# Patient Record
Sex: Male | Born: 1935
Health system: Southern US, Community
[De-identification: ages and names within clinical notes are randomized; demographics above are authoritative.]

## PROBLEM LIST (undated history)

## (undated) DIAGNOSIS — R001 Bradycardia, unspecified: Secondary | ICD-10-CM

## (undated) HISTORY — PX: TONSILLECTOMY: SUR1361

---

## 2018-06-21 DIAGNOSIS — R001 Bradycardia, unspecified: Secondary | ICD-10-CM

## 2018-06-21 DIAGNOSIS — N179 Acute kidney failure, unspecified: Secondary | ICD-10-CM

## 2018-06-21 DIAGNOSIS — I471 Supraventricular tachycardia: Secondary | ICD-10-CM

## 2018-06-21 DIAGNOSIS — R55 Syncope and collapse: Secondary | ICD-10-CM

## 2018-06-24 DIAGNOSIS — I471 Supraventricular tachycardia, unspecified: Secondary | ICD-10-CM

## 2018-06-24 DIAGNOSIS — R55 Syncope and collapse: Secondary | ICD-10-CM

## 2018-06-24 HISTORY — DX: Supraventricular tachycardia, unspecified: I47.10

## 2018-06-24 HISTORY — DX: Syncope and collapse: R55

## 2018-06-30 ENCOUNTER — Encounter: Payer: Self-pay | Admitting: Cardiology

## 2018-06-30 ENCOUNTER — Ambulatory Visit: Payer: Medicare PPO | Admitting: Cardiology

## 2018-06-30 DIAGNOSIS — R55 Syncope and collapse: Secondary | ICD-10-CM | POA: Diagnosis not present

## 2018-06-30 DIAGNOSIS — R001 Bradycardia, unspecified: Secondary | ICD-10-CM | POA: Diagnosis not present

## 2018-06-30 DIAGNOSIS — I471 Supraventricular tachycardia: Secondary | ICD-10-CM

## 2018-06-30 HISTORY — DX: Bradycardia, unspecified: R00.1

## 2018-06-30 NOTE — Progress Notes (Signed)
Cardiology Office Note:    Date:  06/30/2018   ID:  Dillon Wallace, DOB 08-16-1933, MRN [id number]  PCP:  Lowella Dandy, NP  Cardiologist:  Shirlee More, MD   Referring MD: Lowella Dandy, NP  ASSESSMENT:    1. PSVT (paroxysmal supraventricular tachycardia) ([place])   2. Near syncope   3. Bradycardia with 41-50 beats per minute    PLAN:    In order of problems listed above:  1. I suspect this episode of brief SVT is unrelated to his clinical syndrome of bradycardia and syncope for further evaluation I asked him use a 14-day ZIO monitor unfortunately he does not have a smart phone and cannot use the iPhone adapter and if he had recurrent episodes I would favor an implanted loop recorder.  I suspect bradycardia is the mechanism. 2. See above I doubt if SVT is the etiology 3. Further evaluation 14-day ambulatory monitor and if recurrent implanted loop recorder.  He had normal echo in the hospital and I do not think he requires an ischemia evaluation  Next appointment   Medication Adjustments/Labs and Tests Ordered: Current medicines are reviewed at length with the patient today.  Concerns regarding medicines are outlined above.  No orders of the defined types were placed in this encounter.  No orders of the defined types were placed in this encounter.    Chief Complaint  Patient presents with  . Loss of Consciousness    History of Present Illness:    Dillon Wallace is a 85 y.o. male who is being seen today for the evaluation of near syncope,  undocumented bradycardia and SVT  at the request of Moon, Amy A, NP. He recently presented to [facility] with repeated episodes of near loss of consciousness.  There is a notation that he had heart rates in the 40s but unfortunately is no documented arrhythmia during EMS transport.  In the hospitalist, note that he had brief SVT no other arrhythmia was seen during his hospitalization he has advised to see cardiology following discharge.  An  echocardiogram showed normal left ventricular function otherwise normal CT of the head was unremarkable laboratory studies showed a normal CBC renal function was normal troponin and d-dimer were normal BNP level low 176.  EKG performed at the hospital independently reviewed shows sinus rhythm single PVCs right bundle branch block.  Interestingly with the issue of bradycardia he was put on a beta-blocker at discharge metoprolol unspecified dose unspecified frequency.  Prior to his office visit I reviewed the hospital records including emergency room notes as well as discharge summary and independently reviewed the EKG  Approximately 1 year ago he had an episode of vertigo dissimilar from this.  This episode was characterized by feeling he thinks consciousness his vision great out he had some headache with it and every time he would stand up the symptoms recur 1 of his neighbors and nurse came over and said his pulse was in the low 40s at the time.  He has no known history of heart disease no exercise intolerance chest pain shortness of breath no palpitation or syncope.  Been under intense stress with his wife's prolonged hospitalization with diverticulitis surgery and colostomy.  He takes no over-the-counter proarrhythmic drugs he has had no recurrent episodes and he has not taken the dose of beta-blocker prescribed at the hospital.  History reviewed. No pertinent past medical history.  Past Surgical History:  Procedure Laterality Date  . TONSILLECTOMY      Current  Medications: Current Meds  Medication Sig  . metoprolol tartrate (LOPRESSOR) 25 MG tablet Take 25 mg by mouth 2 (two) times daily as needed.   . montelukast (SINGULAIR) 10 MG tablet Take 10 mg by mouth at bedtime.     Allergies:   Patient has no allergy information on record.   Social History   Socioeconomic History  . Marital status: Married    Spouse name: Not on file  . Number of children: Not on file  . Years of education: Not  on file  . Highest education level: Not on file  Occupational History  . Not on file  Social Needs  . Financial resource strain: Not on file  . Food insecurity:    Worry: Not on file    Inability: Not on file  . Transportation needs:    Medical: Not on file    Non-medical: Not on file  Tobacco Use  . Smoking status: Former Smoker    Packs/day: 1.00    Years: 5.00    Pack years: 5.00    Last attempt to quit: 06/23/1962    Years since quitting: 56.0  . Smokeless tobacco: Never Used  Substance and Sexual Activity  . Alcohol use: Yes    Comment: occassionally  . Drug use: Never  . Sexual activity: Not on file  Lifestyle  . Physical activity:    Days per week: Not on file    Minutes per session: Not on file  . Stress: Not on file  Relationships  . Social connections:    Talks on phone: Not on file    Gets together: Not on file    Attends religious service: Not on file    Active member of club or organization: Not on file    Attends meetings of clubs or organizations: Not on file    Relationship status: Not on file  Other Topics Concern  . Not on file  Social History Narrative  . Not on file     Family History: The patient's family history includes COPD in his brother; Heart attack in his father.  ROS:   Review of Systems  Constitution: Negative.  HENT: Negative.   Eyes: Positive for blurred vision (with near syncope).  Cardiovascular: Positive for near-syncope. Negative for leg swelling.  Respiratory: Negative.   Endocrine: Negative.   Hematologic/Lymphatic: Negative.   Skin: Negative.   Musculoskeletal: Negative.   Gastrointestinal: Negative.   Genitourinary: Negative.   Neurological: Positive for headaches and vertigo.  Psychiatric/Behavioral: Negative.   Allergic/Immunologic: Negative.    Please see the history of present illness.     All other systems reviewed and are negative.  EKGs/Labs/Other Studies Reviewed:    The following studies were reviewed  today:   EKG:  EKG is  ordered today.  The ekg ordered today demonstrates [facility] and normal  Recent Labs: No results found for requested labs within last 8760 hours.  Recent Lipid Panel No results found for: CHOL, TRIG, HDL, CHOLHDL, VLDL, LDLCALC, LDLDIRECT  Physical Exam:    VS:  BP (!) 138/54 (BP Location: Left Arm, Patient Position: Sitting, Cuff Size: Normal)   Pulse 73   Ht 5\' 10"  (1.778 m)   Wt 185 lb 2 oz (84 kg)   SpO2 98%   BMI 26.56 kg/m     Wt Readings from Last 3 Encounters:  06/30/18 185 lb 2 oz (84 kg)     GEN:  Well nourished, well developed in no acute distress HEENT: Normal NECK:  No JVD; No carotid bruits LYMPHATICS: No lymphadenopathy CARDIAC: RRR, no murmurs, rubs, gallops RESPIRATORY:  Clear to auscultation without rales, wheezing or rhonchi  ABDOMEN: Soft, non-tender, non-distended MUSCULOSKELETAL:  No edema; No deformity  SKIN: Warm and dry NEUROLOGIC:  Alert and oriented x 3 PSYCHIATRIC:  Normal affect     Signed, Shirlee More, MD  06/30/2018 2:21 PM    [facility]

## 2018-06-30 NOTE — Patient Instructions (Signed)
Medication Instructions:  Your physician has recommended you make the following change in your medication:   STOP metoprolol tartrate (lopressor)  If you need a refill on your cardiac medications before your next appointment, please call your pharmacy.   Lab work: None  If you have labs (blood work) drawn today and your tests are completely normal, you will receive your results only by: Marland Kitchen MyChart Message (if you have MyChart) OR . A paper copy in the mail If you have any lab test that is abnormal or we need to change your treatment, we will call you to review the results.  Testing/Procedures: You had an EKG today.  Your physician has recommended that you wear a ZIO monitor. ZIO monitors are medical devices that record the heart's electrical activity. Doctors most often use these monitors to diagnose arrhythmias. Arrhythmias are problems with the speed or rhythm of the heartbeat. The monitor is a small, portable device. You can wear one while you do your normal daily activities. This is usually used to diagnose what is causing palpitations/syncope (passing out). Wear for 14 days.   Follow-Up: At [facility], you and your health needs are our priority.  As part of our continuing mission to provide you with exceptional heart care, we have created designated Provider Care Teams.  These Care Teams include your primary Cardiologist (physician) and Advanced Practice Providers (APPs -  Physician Assistants and Nurse Practitioners) who all work together to provide you with the care you need, when you need it. You will need a follow up appointment in 6 weeks.

## 2018-07-08 ENCOUNTER — Inpatient Hospital Stay (HOSPITAL_COMMUNITY)
Admission: AD | Admit: 2018-07-08 | Discharge: 2018-07-10 | DRG: 244 | Disposition: A | Discharge status: Home / Self Care | Payer: Medicare PPO | Source: Other Acute Inpatient Hospital | Attending: Cardiovascular Disease | Admitting: Cardiovascular Disease

## 2018-07-08 ENCOUNTER — Other Ambulatory Visit: Payer: Self-pay

## 2018-07-08 ENCOUNTER — Encounter (HOSPITAL_COMMUNITY): Payer: Self-pay | Admitting: General Practice

## 2018-07-08 DIAGNOSIS — R55 Syncope and collapse: Secondary | ICD-10-CM | POA: Diagnosis present

## 2018-07-08 DIAGNOSIS — Z87891 Personal history of nicotine dependence: Secondary | ICD-10-CM

## 2018-07-08 DIAGNOSIS — Z8249 Family history of ischemic heart disease and other diseases of the circulatory system: Secondary | ICD-10-CM

## 2018-07-08 DIAGNOSIS — Z880 Allergy status to penicillin: Secondary | ICD-10-CM

## 2018-07-08 DIAGNOSIS — I4510 Unspecified right bundle-branch block: Secondary | ICD-10-CM | POA: Diagnosis present

## 2018-07-08 DIAGNOSIS — I441 Atrioventricular block, second degree: Principal | ICD-10-CM | POA: Diagnosis present

## 2018-07-08 DIAGNOSIS — Z825 Family history of asthma and other chronic lower respiratory diseases: Secondary | ICD-10-CM | POA: Diagnosis not present

## 2018-07-08 DIAGNOSIS — I10 Essential (primary) hypertension: Secondary | ICD-10-CM | POA: Diagnosis present

## 2018-07-08 DIAGNOSIS — Z950 Presence of cardiac pacemaker: Secondary | ICD-10-CM

## 2018-07-08 HISTORY — DX: Bradycardia, unspecified: R00.1

## 2018-07-08 HISTORY — DX: Syncope and collapse: R55

## 2018-07-08 LAB — CBC
HCT: 40.1 % (ref 39.0–52.0)
Hemoglobin: 13.7 g/dL (ref 13.0–17.0)
MCH: 34 pg (ref 26.0–34.0)
MCHC: 34.2 g/dL (ref 30.0–36.0)
MCV: 99.5 fL (ref 80.0–100.0)
PLATELETS: 128 10*3/uL — AB (ref 150–400)
RBC: 4.03 MIL/uL — ABNORMAL LOW (ref 4.22–5.81)
RDW: 12.8 % (ref 11.5–15.5)
WBC: 5.5 10*3/uL (ref 4.0–10.5)
nRBC: 0 % (ref 0.0–0.2)

## 2018-07-08 LAB — CREATININE, SERUM
Creatinine, Ser: 1.21 mg/dL (ref 0.61–1.24)
GFR calc Af Amer: >60 mL/min (ref >60)
GFR calc non Af Amer: 55 mL/min — ABNORMAL LOW (ref >60)

## 2018-07-08 LAB — TROPONIN I: Troponin I: <0.03 ng/mL (ref <0.03)

## 2018-07-08 LAB — PROTIME-INR
INR: 1.03
Prothrombin Time: 13.4 seconds (ref 11.4–15.2)

## 2018-07-08 MED ORDER — HYDRALAZINE HCL 25 MG PO TABS
25.0000 mg | ORAL_TABLET | Freq: Three times a day (TID) | ORAL | Status: DC
  Administered 2018-07-08 – 2018-07-10 (×6): 25 mg via ORAL
  Filled 2018-07-08 (×6): qty 1

## 2018-07-08 MED ORDER — SODIUM CHLORIDE 0.9 % IV SOLN
INTRAVENOUS | Status: DC
  Administered 2018-07-08 – 2018-07-10 (×3): via INTRAVENOUS

## 2018-07-08 MED ORDER — VITAMIN C 500 MG PO TABS
250.0000 mg | ORAL_TABLET | Freq: Every day | ORAL | Status: DC
  Administered 2018-07-08 – 2018-07-10 (×3): 250 mg via ORAL
  Filled 2018-07-08 (×3): qty 1

## 2018-07-08 MED ORDER — MONTELUKAST SODIUM 10 MG PO TABS
10.0000 mg | ORAL_TABLET | Freq: Every day | ORAL | Status: DC
  Administered 2018-07-08 – 2018-07-09 (×2): 10 mg via ORAL
  Filled 2018-07-08 (×2): qty 1

## 2018-07-08 MED ORDER — ADULT MULTIVITAMIN W/MINERALS CH
1.0000 | ORAL_TABLET | Freq: Every day | ORAL | Status: DC
  Administered 2018-07-08 – 2018-07-10 (×3): 1 via ORAL
  Filled 2018-07-08 (×3): qty 1

## 2018-07-08 MED ORDER — HEPARIN SODIUM (PORCINE) 5000 UNIT/ML IJ SOLN
5000.0000 [IU] | Freq: Three times a day (TID) | INTRAMUSCULAR | Status: DC
  Administered 2018-07-08 – 2018-07-09 (×2): 5000 [IU] via SUBCUTANEOUS
  Filled 2018-07-08 (×2): qty 1

## 2018-07-08 NOTE — Progress Notes (Signed)
Pt arrived to 4E-18 via stretcher by care line from [facility]. Pt sat in chair. CHG bath given. Tele applied, CCMD notified. Dr. Doylene Canard notified pt arrived to floor. Awaiting orders, will continue to monitor.  Amanda Cockayne, RN

## 2018-07-08 NOTE — H&P (Signed)
Referring Physician: Laverna Peace, NP/[place] [facility] ED  Dillon Wallace is an 85 y.o. male.                       Chief Complaint: Near syncope  HPI: 85 year old male has 3 weeks history of near syncope episodes. He had documented bradycardia and in [place] ED he had 2nd degree AV block type 2 with RBBB. He is not taking any medications other than multivitamin, vitamin C and Montelukast 10 mg. He denies chest pain. He has remote history of smoking which he quit 56 years ago.  Past Medical History:  Diagnosis Date  . Bradycardia       Past Surgical History:  Procedure Laterality Date  . TONSILLECTOMY      Family History  Problem Relation Age of Onset  . Heart attack Father   . COPD Brother    Social History:  reports that he quit smoking about 56 years ago. He has a 5.00 pack-year smoking history. He has never used smokeless tobacco. He reports current alcohol use. He reports that he does not use drugs.  Allergies:  Allergies  Allergen Reactions  . Penicillins Other (See Comments)    Did it involve swelling of the face/tongue/throat, SOB, or low BP? No Did it involve sudden or severe rash/hives, skin peeling, or any reaction on the inside of your mouth or nose? Yes Did you need to seek medical attention at a hospital or doctor's office? Yes When did it last happen?years ago If all above answers are "NO", may proceed with cephalosporin use.    Medications Prior to Admission  Medication Sig Dispense Refill  . aspirin 325 MG tablet Take 650 mg by mouth once.    . montelukast (SINGULAIR) 10 MG tablet Take 10 mg by mouth at bedtime.    . Multiple Vitamin (MULTIVITAMIN WITH MINERALS) TABS tablet Take 1 tablet by mouth daily.    . vitamin C (ASCORBIC ACID) 250 MG tablet Take 250 mg by mouth daily.      No results found for this or any previous visit (from the past 48 hour(s)). No results found.  Review Of Systems Constitutional: No fever, chills, weight loss or gain. Eyes:  No vision change, wears glasses. No discharge or pain. Ears: No hearing loss, No tinnitus. Respiratory: No asthma, COPD, pneumonias. No shortness of breath. No hemoptysis. Cardiovascular: No chest pain, occasional palpitation, leg edema. Gastrointestinal: No nausea, vomiting, diarrhea, constipation. No GI bleed. No hepatitis. Genitourinary: No dysuria, hematuria, kidney stone. No incontinance. Neurological: No headache, stroke, seizures.  Psychiatry: No psych facility admission for anxiety, depression, suicide. No detox. Skin: No rash. Musculoskeletal: Positive joint pain, no fibromyalgia. No neck pain, back pain. Lymphadenopathy: No lymphadenopathy. Hematology: No anemia or easy bruising.   Blood pressure (!) 163/74, pulse (!) 58, temperature (!) 97.5 F (36.4 C), temperature source Oral, resp. rate 15, SpO2 97 %. There is no height or weight on file to calculate BMI. General appearance: alert, cooperative, appears stated age and no distress Head: Normocephalic, atraumatic. Eyes: Blue eyes, pink conjunctiva, corneas clear. PERRL, EOM's intact. Neck: No adenopathy, no carotid bruit, no JVD, supple, symmetrical, trachea midline and thyroid not enlarged. Resp: Clear to auscultation bilaterally. Cardio: Regular rate and rhythm, S1, S2 normal, II/VI systolic murmur, no click, rub or gallop GI: Soft, non-tender; bowel sounds normal; no organomegaly. Extremities: No edema, cyanosis or clubbing. Skin: Warm and dry.  Neurologic: Alert and oriented X 3, normal strength. Normal  coordination.  Assessment/Plan Pre-syncope 2nd degree type 2 AV block RBBB. Hypertension  Check TSH. IV fluids. May need permanent pacemaker for symptomatic bradycardia if heart rate do not improve. Echocardiogram for LV function.  Dillon Riddle, MD  07/08/2018, 6:22 PM

## 2018-07-09 ENCOUNTER — Encounter (HOSPITAL_COMMUNITY)
Admission: AD | Disposition: A | Discharge status: Home / Self Care | Payer: Self-pay | Source: Other Acute Inpatient Hospital | Attending: Cardiovascular Disease

## 2018-07-09 ENCOUNTER — Inpatient Hospital Stay (HOSPITAL_COMMUNITY): Payer: Medicare PPO

## 2018-07-09 ENCOUNTER — Encounter (HOSPITAL_COMMUNITY): Payer: Self-pay | Admitting: Internal Medicine

## 2018-07-09 DIAGNOSIS — I441 Atrioventricular block, second degree: Secondary | ICD-10-CM

## 2018-07-09 DIAGNOSIS — R55 Syncope and collapse: Secondary | ICD-10-CM

## 2018-07-09 HISTORY — PX: PACEMAKER IMPLANT: EP1218

## 2018-07-09 LAB — COMPREHENSIVE METABOLIC PANEL
ALT: 19 U/L (ref 0–44)
AST: 16 U/L (ref 15–41)
Albumin: 3.4 g/dL — ABNORMAL LOW (ref 3.5–5.0)
Alkaline Phosphatase: 44 U/L (ref 38–126)
Anion gap: 8 (ref 5–15)
BUN: 16 mg/dL (ref 8–23)
CO2: 21 mmol/L — ABNORMAL LOW (ref 22–32)
Calcium: 8.4 mg/dL — ABNORMAL LOW (ref 8.9–10.3)
Chloride: 109 mmol/L (ref 98–111)
Creatinine, Ser: 1.36 mg/dL — ABNORMAL HIGH (ref 0.61–1.24)
GFR calc Af Amer: 55 mL/min — ABNORMAL LOW (ref >60)
GFR calc non Af Amer: 48 mL/min — ABNORMAL LOW (ref >60)
Glucose, Bld: 95 mg/dL (ref 70–99)
Potassium: 4.3 mmol/L (ref 3.5–5.1)
Sodium: 138 mmol/L (ref 135–145)
Total Bilirubin: 1.1 mg/dL (ref 0.3–1.2)
Total Protein: 6.7 g/dL (ref 6.5–8.1)

## 2018-07-09 LAB — ECHOCARDIOGRAM COMPLETE: Weight: 2931.2 oz

## 2018-07-09 LAB — CBC
HCT: 37 % — ABNORMAL LOW (ref 39.0–52.0)
Hemoglobin: 12.1 g/dL — ABNORMAL LOW (ref 13.0–17.0)
MCH: 32.5 pg (ref 26.0–34.0)
MCHC: 32.7 g/dL (ref 30.0–36.0)
MCV: 99.5 fL (ref 80.0–100.0)
Platelets: 137 10*3/uL — ABNORMAL LOW (ref 150–400)
RBC: 3.72 MIL/uL — AB (ref 4.22–5.81)
RDW: 12.9 % (ref 11.5–15.5)
WBC: 4.3 10*3/uL (ref 4.0–10.5)
nRBC: 0 % (ref 0.0–0.2)

## 2018-07-09 LAB — SURGICAL PCR SCREEN
MRSA, PCR: NEGATIVE
Staphylococcus aureus: NEGATIVE

## 2018-07-09 LAB — TROPONIN I
Troponin I: <0.03 ng/mL (ref <0.03)
Troponin I: <0.03 ng/mL (ref <0.03)

## 2018-07-09 LAB — TSH: TSH: 2.168 u[IU]/mL (ref 0.350–4.500)

## 2018-07-09 SURGERY — PACEMAKER IMPLANT

## 2018-07-09 MED ORDER — VANCOMYCIN HCL IN DEXTROSE 1-5 GM/200ML-% IV SOLN
1000.0000 mg | Freq: Two times a day (BID) | INTRAVENOUS | Status: AC
  Administered 2018-07-09: 1000 mg via INTRAVENOUS
  Filled 2018-07-09: qty 200

## 2018-07-09 MED ORDER — HEPARIN (PORCINE) IN NACL 1000-0.9 UT/500ML-% IV SOLN
INTRAVENOUS | Status: AC
  Filled 2018-07-09: qty 500

## 2018-07-09 MED ORDER — SODIUM CHLORIDE 0.9 % IV SOLN: INTRAVENOUS | Status: DC

## 2018-07-09 MED ORDER — SODIUM CHLORIDE 0.9 % IV SOLN
80.0000 mg | INTRAVENOUS | Status: AC
  Administered 2018-07-09: 80 mg

## 2018-07-09 MED ORDER — LIDOCAINE HCL (PF) 1 % IJ SOLN
INTRAMUSCULAR | Status: AC
  Filled 2018-07-09: qty 60

## 2018-07-09 MED ORDER — SODIUM CHLORIDE 0.9 % IV SOLN
INTRAVENOUS | Status: AC
  Filled 2018-07-09: qty 2

## 2018-07-09 MED ORDER — VANCOMYCIN HCL IN DEXTROSE 1-5 GM/200ML-% IV SOLN
1000.0000 mg | INTRAVENOUS | Status: AC
  Administered 2018-07-09: 1000 mg via INTRAVENOUS

## 2018-07-09 MED ORDER — ACETAMINOPHEN 325 MG PO TABS
325.0000 mg | ORAL_TABLET | ORAL | Status: DC | PRN
  Administered 2018-07-09 – 2018-07-10 (×4): 650 mg via ORAL
  Filled 2018-07-09 (×4): qty 2

## 2018-07-09 MED ORDER — VANCOMYCIN HCL IN DEXTROSE 1-5 GM/200ML-% IV SOLN
INTRAVENOUS | Status: AC
  Filled 2018-07-09: qty 200

## 2018-07-09 MED ORDER — FENTANYL CITRATE (PF) 100 MCG/2ML IJ SOLN
INTRAMUSCULAR | Status: DC | PRN
  Administered 2018-07-09 (×2): 12.5 ug via INTRAVENOUS
  Administered 2018-07-09: 25 ug via INTRAVENOUS

## 2018-07-09 MED ORDER — ACETAMINOPHEN 325 MG PO TABS
650.0000 mg | ORAL_TABLET | Freq: Four times a day (QID) | ORAL | Status: DC | PRN
  Administered 2018-07-09: 650 mg via ORAL
  Filled 2018-07-09: qty 2

## 2018-07-09 MED ORDER — CHLORHEXIDINE GLUCONATE 4 % EX LIQD: 60.0000 mL | Freq: Once | CUTANEOUS | Status: DC

## 2018-07-09 MED ORDER — ONDANSETRON HCL 4 MG/2ML IJ SOLN: 4.0000 mg | Freq: Four times a day (QID) | INTRAMUSCULAR | Status: DC | PRN

## 2018-07-09 MED ORDER — SODIUM CHLORIDE 0.9 % IV SOLN: 250.0000 mL | INTRAVENOUS | Status: DC

## 2018-07-09 MED ORDER — SODIUM CHLORIDE 0.9% FLUSH: 3.0000 mL | Freq: Two times a day (BID) | INTRAVENOUS | Status: DC

## 2018-07-09 MED ORDER — SODIUM CHLORIDE 0.9% FLUSH: 3.0000 mL | INTRAVENOUS | Status: DC | PRN

## 2018-07-09 MED ORDER — MIDAZOLAM HCL 5 MG/5ML IJ SOLN
INTRAMUSCULAR | Status: DC | PRN
  Administered 2018-07-09: 1 mg via INTRAVENOUS
  Administered 2018-07-09: 2 mg via INTRAVENOUS
  Administered 2018-07-09: 1 mg via INTRAVENOUS

## 2018-07-09 MED ORDER — MIDAZOLAM HCL 5 MG/5ML IJ SOLN
INTRAMUSCULAR | Status: AC
  Filled 2018-07-09: qty 5

## 2018-07-09 MED ORDER — FENTANYL CITRATE (PF) 100 MCG/2ML IJ SOLN
INTRAMUSCULAR | Status: AC
  Filled 2018-07-09: qty 2

## 2018-07-09 MED ORDER — LIDOCAINE HCL (PF) 1 % IJ SOLN
INTRAMUSCULAR | Status: DC | PRN
  Administered 2018-07-09: 45 mL

## 2018-07-09 MED ORDER — HEPARIN (PORCINE) IN NACL 1000-0.9 UT/500ML-% IV SOLN
INTRAVENOUS | Status: DC | PRN
  Administered 2018-07-09: 500 mL

## 2018-07-09 SURGICAL SUPPLY — 12 items
CABLE SURGICAL S-101-97-12 (CABLE) ×3 IMPLANT
CATH RIGHTSITE C315HIS02 (CATHETERS) ×2 IMPLANT
IPG PACE AZUR XT DR MRI W1DR01 (Pacemaker) IMPLANT
LEAD CAPSURE NOVUS 5076-52CM (Lead) ×2 IMPLANT
LEAD SELECT SECURE 3830 383069 (Lead) IMPLANT
PACE AZURE XT DR MRI W1DR01 (Pacemaker) ×3 IMPLANT
PAD PRO RADIOLUCENT 2001M-C (PAD) ×3 IMPLANT
SELECT SECURE 3830 383069 (Lead) ×3 IMPLANT
SHEATH CLASSIC 7F (SHEATH) ×4 IMPLANT
SLITTER 6232ADJ (MISCELLANEOUS) ×2 IMPLANT
TRAY PACEMAKER INSERTION (PACKS) ×3 IMPLANT
WIRE HI TORQ VERSACORE-J 145CM (WIRE) ×2 IMPLANT

## 2018-07-09 NOTE — Progress Notes (Signed)
Ref: Lowella Dandy, NP   Subjective:  Had DDD pacemaker implanted 2 hours ago. Monitor paced at 70-80 bpm. Left pectoral area appears stable. Left arm sling applied. Echocardiogram with normal LV systolic function. Moderate MR.  Objective:  Vital Signs in the last 24 hours: Temp:  [97.3 F (36.3 C)-98.3 F (36.8 C)] 98.3 F (36.8 C) (01/17 1215) Pulse Rate:  [39-78] 78 (01/17 1230) Cardiac Rhythm: Ventricular paced (01/17 1215) Resp:  [7-22] 20 (01/17 1230) BP: (122-183)/(60-81) 137/75 (01/17 1230) SpO2:  [95 %-100 %] 95 % (01/17 1230) Weight:  [83.1 kg] 83.1 kg (01/17 0627)  Physical Exam: BP Readings from Last 1 Encounters:  07/09/18 137/75     Wt Readings from Last 1 Encounters:  07/09/18 83.1 kg    Weight change:  Body mass index is 26.29 kg/m. HEENT: [place]/AT, Eyes-Blue, PERL, EOMI, Conjunctiva-Pink, Sclera-Non-icteric Neck: No JVD, No bruit, Trachea midline. Lungs:  Clear, Bilateral. Left pectoral pacer pocket with dressing. Cardiac:  Regular rhythm, normal S1 and S2, no S3. II/VI systolic murmur. Abdomen:  Soft, non-tender. BS present. Extremities:  No edema present. No cyanosis. No clubbing. Left elbow in sling. CNS: AxOx3, Cranial nerves grossly intact, moves all 4 extremities.  Skin: Warm and dry.   Intake/Output from previous day: 01/16 0701 - 01/17 0700 In: 65.4 [I.V.:65.4] Out: 125 [Urine:125]    Lab Results: BMET    Component Value Date/Time   NA 138 07/09/2018 0650   K 4.3 07/09/2018 0650   CL 109 07/09/2018 0650   CO2 21 (L) 07/09/2018 0650   GLUCOSE 95 07/09/2018 0650   BUN 16 07/09/2018 0650   CREATININE 1.36 (H) 07/09/2018 0650   CREATININE 1.21 07/08/2018 1858   CALCIUM 8.4 (L) 07/09/2018 0650   GFRNONAA 48 (L) 07/09/2018 0650   GFRNONAA 55 (L) 07/08/2018 1858   GFRAA 55 (L) 07/09/2018 0650   GFRAA >60 07/08/2018 1858   CBC    Component Value Date/Time   WBC 4.3 07/09/2018 0650   RBC 3.72 (L) 07/09/2018 0650   HGB 12.1 (L) 07/09/2018  0650   HCT 37.0 (L) 07/09/2018 0650   PLT 137 (L) 07/09/2018 0650   MCV 99.5 07/09/2018 0650   MCH 32.5 07/09/2018 0650   MCHC 32.7 07/09/2018 0650   RDW 12.9 07/09/2018 0650   HEPATIC Function Panel Recent Labs    07/09/18 0650  PROT 6.7   HEMOGLOBIN A1C No components found for: HGA1C,  MPG CARDIAC ENZYMES Lab Results  Component Value Date   TROPONINI <0.03 07/09/2018   TROPONINI <0.03 07/08/2018   TROPONINI <0.03 07/08/2018   BNP No results for input(s): PROBNP in the last 8760 hours. TSH Recent Labs    07/08/18 2358  TSH 2.168   CHOLESTEROL No results for input(s): CHOL in the last 8760 hours.  Scheduled Meds: . hydrALAZINE  25 mg Oral Q8H  . montelukast  10 mg Oral QHS  . multivitamin with minerals  1 tablet Oral Daily  . vitamin C  250 mg Oral Daily   Continuous Infusions: . sodium chloride 75 mL/hr at 07/08/18 1700  . vancomycin     PRN Meds:.acetaminophen, ondansetron (ZOFRAN) IV  Assessment/Plan: Symptomatic 2nd degree 2:1 AV block Pre-syncope from above RBBB Hypertension  Continue medical treatment. Home in AM if stable.  F/U in 1 week.   LOS: 1 day    Dixie Dials  MD  07/09/2018, 2:05 PM

## 2018-07-09 NOTE — Progress Notes (Signed)
  Echocardiogram 2D Echocardiogram has been performed.  Dillon Wallace 07/09/2018, 8:44 AM

## 2018-07-09 NOTE — Discharge Instructions (Signed)
° ° °  Supplemental Discharge Instructions for  Pacemaker/Defibrillator Patients  Activity No heavy lifting or vigorous activity with your left/right arm for 6 to 8 weeks.  Do not raise your left/right arm above your head for one week.  Gradually raise your affected arm as drawn below.              07/12/2018                07/13/2018                 07/14/2018              07/16/2018 __  NO DRIVING for  1 week   ; you may begin driving on  3/54/5625   .  WOUND CARE - Keep the wound area clean and dry.  Do not get this area wet for one week. No showers for one week; you may shower on 07/16/2018   . - The tape/steri-strips on your wound will fall off; do not pull them off.  No bandage is needed on the site.  DO  NOT apply any creams, oils, or ointments to the wound area. - If you notice any drainage or discharge from the wound, any swelling or bruising at the site, or you develop a fever > 101? F after you are discharged home, call the office at once.  Special Instructions - You are still able to use cellular telephones; use the ear opposite the side where you have your pacemaker/defibrillator.  Avoid carrying your cellular phone near your device. - When traveling through airports, show security personnel your identification card to avoid being screened in the metal detectors.  Ask the security personnel to use the hand wand. - Avoid arc welding equipment, MRI testing (magnetic resonance imaging), TENS units (transcutaneous nerve stimulators).  Call the office for questions about other devices. - Avoid electrical appliances that are in poor condition or are not properly grounded. - Microwave ovens are safe to be near or to operate.

## 2018-07-09 NOTE — H&P (Signed)
EP Consult  Referring Physician: Mat Carne, MD Primary cardiologist: Dillon More, MD  Dillon Wallace is an 85 y.o. male.                       Chief Complaint: Near syncope  HPI: 85 year old male has 3 weeks history of near syncope episodes. He had documented bradycardia and in [place] ED he had 2nd degree AV block type 2 with RBBB. He is not taking any medications other than multivitamin, vitamin C and Montelukast 10 mg. He denies chest pain. He has remote history of smoking which he quit 56 years ago. He carries a diagnosis of SVT though none is available for evaluation.       Past Medical History:  Diagnosis Date  . Bradycardia            Past Surgical History:  Procedure Laterality Date  . TONSILLECTOMY           Family History  Problem Relation Age of Onset  . Heart attack Father   . COPD Brother    Social History:  reports that he quit smoking about 56 years ago. He has a 5.00 pack-year smoking history. He has never used smokeless tobacco. He reports current alcohol use. He reports that he does not use drugs.  Allergies:       Allergies  Allergen Reactions  . Penicillins Other (See Comments)    Did it involve swelling of the face/tongue/throat, SOB, or low BP? No Did it involve sudden or severe rash/hives, skin peeling, or any reaction on the inside of your mouth or nose? Yes Did you need to seek medical attention at a hospital or doctor's office? Yes When did it last happen?years ago If all above answers are "NO", may proceed with cephalosporin use.          Medications Prior to Admission  Medication Sig Dispense Refill  . aspirin 325 MG tablet Take 650 mg by mouth once.    . montelukast (SINGULAIR) 10 MG tablet Take 10 mg by mouth at bedtime.    . Multiple Vitamin (MULTIVITAMIN WITH MINERALS) TABS tablet Take 1 tablet by mouth daily.    . vitamin C (ASCORBIC ACID) 250 MG tablet Take 250 mg by mouth daily.       LabResultsLast48Hours  No results found for this or any previous visit (from the past 48 hour(s)).   ImagingResults(Last48hours)  No results found.    Review Of Systems Constitutional: No fever, chills, weight loss or gain. Eyes: No vision change, wears glasses. No discharge or pain. Ears: No hearing loss, No tinnitus. Respiratory: No asthma, COPD, pneumonias. No shortness of breath. No hemoptysis. Cardiovascular: No chest pain, occasional palpitation, leg edema. Gastrointestinal: No nausea, vomiting, diarrhea, constipation. No GI bleed. No hepatitis. Genitourinary: No dysuria, hematuria, kidney stone. No incontinance. Neurological: No headache, stroke, seizures.  Psychiatry: No psych facility admission for anxiety, depression, suicide. No detox. Skin: No rash. Musculoskeletal: Positive joint pain, no fibromyalgia. No neck pain, back pain. Lymphadenopathy: No lymphadenopathy. Hematology: No anemia or easy bruising.   Blood pressure (!) 163/74, pulse 40, temperature (!) 97.5 F (36.4 C), temperature source Oral, resp. rate 15, SpO2 97 %. There is no height or weight on file to calculate BMI. General appearance: alert, cooperative, appears stated age and no distress Head: Normocephalic, atraumatic. Eyes: Blue eyes, pink conjunctiva, corneas clear. PERRL, EOM's intact. Neck: No adenopathy, no carotid bruit, no JVD, supple, symmetrical, trachea midline and thyroid  not enlarged. Resp: Clear to auscultation bilaterally. Cardio: Regular brady rhythm, S1, S2 normal, II/VI systolic murmur, no click, rub or gallop GI: Soft, non-tender; bowel sounds normal; no organomegaly. Extremities: No edema, cyanosis or clubbing. Skin: Warm and dry.  Neurologic: Alert and oriented X 3, normal strength. Normal coordination.  Assessment/Plan Pre-syncope 2nd degree type 2 AV block RBBB. Hypertension  I have discussed the treatment options with the patient. He has symptomatic 2:1  AV block and RBBB and is not on any medications to slow down his heart. I have discussed the indications/risks/benefits/goals/expectations with the patient and she wishes to proceed. We will check a 2D echocardiogram for LV function  Dillon Wallace,M.D.

## 2018-07-09 NOTE — Progress Notes (Signed)
Orthopedic Tech Progress Note Patient Details:  Dillon Wallace November 29, 1933 [id number] Called up there and the said patient has on sling  Patient ID: Dillon Wallace, male   DOB: July 21, 1933, 85 y.o.   MRN: [id number]   Janit Pagan 07/09/2018, 12:21 PM

## 2018-07-10 ENCOUNTER — Inpatient Hospital Stay (HOSPITAL_COMMUNITY): Payer: Medicare PPO

## 2018-07-10 MED ORDER — ASPIRIN 81 MG PO TABS: 81.0000 mg | ORAL_TABLET | Freq: Once | ORAL | 3 refills | Status: AC

## 2018-07-10 MED ORDER — ACETAMINOPHEN 325 MG PO TABS: 325.0000 mg | ORAL_TABLET | ORAL | 3 refills | Status: DC | PRN

## 2018-07-10 MED ORDER — LOSARTAN POTASSIUM 25 MG PO TABS: 25.0000 mg | ORAL_TABLET | Freq: Every day | ORAL | 11 refills | Status: DC

## 2018-07-10 NOTE — Discharge Summary (Signed)
Dillon Wallace, Dillon Wallace MEDICAL RECORD [id number] ACCOUNT [id number] DATE OF BIRTH:Jul 13, 1933 FACILITY: [facility] LOCATION: [facility] PHYSICIAN:Ibn Stief Daivd Council, MD  DISCHARGE SUMMARY  DATE OF DISCHARGE:  07/10/2018  ADMITTING DIAGNOSES: 1.  Presyncope. 2.  Second-degree type 2 atrioventricular block. 3.  Right bundle branch block. 4.  Hypertension.  FINAL DIAGNOSES: 1.  Symptomatic second-degree type 2 atrioventricular block status post presyncope secondary to above, status post permanent pacemaker insertion. 2.  Hypertension. 3.  Right bundle branch block.  DISCHARGE HOME MEDICATIONS: 1.  Tylenol 325-650 mg every 4 hours as needed for pain. 2.  Losartan 25 mg 1 tablet daily. 3.  Singulair 10 mg daily. 4.  Multivitamin 1 tablet daily. 5.  Vitamin C 250 mg tablet daily. 6.  Aspirin 81 mg daily.  Follow up with PCP as scheduled.  Follow up with Dr. Doylene Canard in 1 to 2 weeks.  Call for appointment.  Follow with Chanetta Marshall, NP, in 1 week.  Follow with Dr. Cristopher Peru on 07/22/2018 at 11 a.m.  Post-pacemaker instructions have been given.  The patient has been advised to avoid any heavy lifting or vigorous activity with his left and right arm for 6-8 weeks.  Do not raise the left arm above the head for 1 week.  Gradually raise the affected arm  as drawn below.  CONDITION AT DISCHARGE:  Stable.  BRIEF HISTORY AND HOSPITAL COURSE:  The patient is an 85 year old male who has a 3-week history of near-syncopal episodes.  He had documented bradycardia and an RVD and had second-degree AV block type 2 with right bundle branch block escape rhythm.  He  is not taking any medications other than multivitamin, vitamin C, Singulair.  He denies any chest pain.  He has remote history of smoking, which he quit 56 years ago.  PHYSICAL EXAMINATION: VITAL SIGNS:  Blood pressure was 163/74, pulse 58.  He was afebrile. HEENT:  Head:  Normocephalic, atraumatic.  Eyes:  Conjunctivae pink.  PERRLA.  Extraocular  movements intact. NECK:  Supple, no carotid bruit, no JVD. LUNGS:  Clear to auscultation bilaterally. CARDIOVASCULAR:  S1, S2 was normal.  There was II/VI systolic murmur. ABDOMEN:  Soft.  Bowel sounds are present, nontender. EXTREMITIES:  There is no clubbing, cyanosis or edema. NEUROLOGIC:  Grossly intact.  LABORATORY DATA:  Sodium was 138, potassium 4.3, BUN 16, creatinine 1.36.  Troponin I 3 sets were normal.  Hemoglobin was 13.7, hematocrit 40.1, white count of 5.5.  TSH was 2.16.  EKG showed a sinus rhythm with second-degree Mobitz type 2 heart block  with right bundle branch block pattern.  A 2D echo showed normal LV systolic function with EF of 60% to 65%.  There was mild tricuspid regurgitation and moderate mitral regurgitation.  BRIEF HOSPITAL COURSE:  The patient was admitted to telemetry unit.  EP consultation was obtained for permanent pacemaker insertion.  The patient subsequently underwent DDD permanent pacemaker insertion yesterday.  The patient tolerated the procedure  well.  There were no complications.  His pacemaker site is dry without any evidence of hematoma or ecchymosis.  The patient did not have any further episodes of dizziness or presyncope.  The patient is pacing appropriately.  The patient has ambulated in  the room and is eager to go home.  The patient will be discharged home on above medications and will be followed up as above.  LN/NUANCE D:07/10/2018 T:07/10/2018 JOB:[id number]

## 2018-07-10 NOTE — Progress Notes (Signed)
Progress Note  Patient Name: Dillon Wallace Date of Encounter: 07/10/2018  Primary Cardiologist: Bettina Gavia  Subjective   Feeling well without complaint  Inpatient Medications    Scheduled Meds: . hydrALAZINE  25 mg Oral Q8H  . montelukast  10 mg Oral QHS  . multivitamin with minerals  1 tablet Oral Daily  . vitamin C  250 mg Oral Daily   Continuous Infusions: . sodium chloride 75 mL/hr at 07/10/18 0600   PRN Meds: acetaminophen, ondansetron (ZOFRAN) IV   Vital Signs    Vitals:   07/09/18 2025 07/09/18 2152 07/10/18 0405 07/10/18 0920  BP: 137/81  (!) 166/78 127/65  Pulse: 77  64 66  Resp: 15  17 (!) 22  Temp: 98.3 F (36.8 C)  97.6 F (36.4 C)   TempSrc: Oral  Oral   SpO2: 99%  99% 98%  Weight:      Height:  5\' 10"  (1.778 m)      Intake/Output Summary (Last 24 hours) at 07/10/2018 0933 Last data filed at 07/10/2018 0600 Gross per 24 hour  Intake 3229.79 ml  Output 1125 ml  Net 2104.79 ml   Last 3 Weights 07/09/2018 06/30/2018  Weight (lbs) 183 lb 3.2 oz 185 lb 2 oz  Weight (kg) 83.099 kg 83.972 kg      Telemetry    A sense, V paced - Personally Reviewed  ECG    A sense, V paced - Personally Reviewed  Physical Exam   GEN: No acute distress.   Neck: No JVD Cardiac: RRR, no murmurs, rubs, or gallops.  Respiratory: Clear to auscultation bilaterally. GI: Soft, nontender, non-distended  MS: No edema; No deformity. Neuro:  Nonfocal  Psych: Normal affect   Labs    Chemistry Recent Labs  Lab 07/08/18 1858 07/09/18 0650  NA  --  138  K  --  4.3  CL  --  109  CO2  --  21*  GLUCOSE  --  95  BUN  --  16  CREATININE 1.21 1.36*  CALCIUM  --  8.4*  PROT  --  6.7  ALBUMIN  --  3.4*  AST  --  16  ALT  --  19  ALKPHOS  --  44  BILITOT  --  1.1  GFRNONAA 55* 48*  GFRAA >60 55*  ANIONGAP  --  8     Hematology Recent Labs  Lab 07/08/18 1858 07/09/18 0650  WBC 5.5 4.3  RBC 4.03* 3.72*  HGB 13.7 12.1*  HCT 40.1 37.0*  MCV 99.5 99.5  MCH 34.0  32.5  MCHC 34.2 32.7  RDW 12.8 12.9  PLT 128* 137*    Cardiac Enzymes Recent Labs  Lab 07/08/18 1858 07/08/18 2358 07/09/18 0650  TROPONINI <0.03 <0.03 <0.03   No results for input(s): TROPIPOC in the last 168 hours.   BNPNo results for input(s): BNP, PROBNP in the last 168 hours.   DDimer No results for input(s): DDIMER in the last 168 hours.   Radiology    Dg Chest 2 View  Result Date: 07/10/2018 CLINICAL DATA:  Pacemaker EXAM: CHEST - 2 VIEW COMPARISON:  None. FINDINGS: Two lead left subclavian pacemaker is noted with lead tips overlying the right atrium and right ventricular apex. Normal heart size. Normal mediastinal contour. No pneumothorax. No pleural effusion. No pulmonary edema. Minimal reticulation at the lung bases. No consolidative airspace disease. IMPRESSION: 1. No pneumothorax. Two lead left subclavian pacemaker appears well positioned. 2. Nonspecific minimal reticulation at the lung  bases. Electronically Signed   By: Ilona Sorrel M.D.   On: 07/10/2018 07:44    Cardiac Studies   TTE 07/09/17 - Left ventricle: The cavity size was normal. Systolic function was   normal. The estimated ejection fraction was in the range of 60%   to 65%. Wall motion was normal; there were no regional wall   motion abnormalities. - Mitral valve: Calcified annulus. Mildly thickened leaflets .   There was moderate regurgitation. - Left atrium: The atrium was mildly dilated. - Right atrium: The atrium was mildly dilated.  Patient Profile     85 y.o. male with multiple near syncopal episodes, found to be in Mobitz II av block  Assessment & Plan    1. Mobitz II AV block: s/p pacemaker. CXR and interrogation without issue. OK for discharge today. Follow up arranged.  [facility] sign off.   Medication Recommendations:  None new Other recommendations (labs, testing, etc):  none Follow up as an outpatient:  Arranged previously  For questions or updates, please contact [place] Please consult [email or web address] for contact info under        Signed, Neo Yepiz Meredith Leeds, MD  07/10/2018, 9:33 AM

## 2018-07-10 NOTE — Progress Notes (Signed)
Patient given discharge instructions, medication list and prescriptions sent to personal pharmacy. Patient also given discharge follow up appointments and patient verbalized understand. Iv and tele dcd. Will discharge home as ordered and transported to exit with wheel chair and nursing staff.

## 2018-07-10 NOTE — Discharge Summary (Signed)
Discharge summary dictated on 07/10/2018 dictation number is [phone]

## 2018-07-22 ENCOUNTER — Ambulatory Visit (INDEPENDENT_AMBULATORY_CARE_PROVIDER_SITE_OTHER): Payer: Medicare PPO | Admitting: Nurse Practitioner

## 2018-07-22 DIAGNOSIS — R001 Bradycardia, unspecified: Secondary | ICD-10-CM

## 2018-07-22 LAB — CUP PACEART INCLINIC DEVICE CHECK
Date Time Interrogation Session: 20200130105149
Implantable Lead Implant Date: 20200117
Implantable Lead Implant Date: 20200117
Implantable Lead Location: 753859
Implantable Lead Location: 753860
Implantable Lead Model: 3830
Implantable Lead Model: 5076
Implantable Pulse Generator Implant Date: 20200117

## 2018-07-22 NOTE — Patient Instructions (Signed)
Medication Instructions:  none If you need a refill on your cardiac medications before your next appointment, please call your pharmacy.   Lab work: none If you have labs (blood work) drawn today and your tests are completely normal, you will receive your results only by: Marland Kitchen MyChart Message (if you have MyChart) OR . A paper copy in the mail If you have any lab test that is abnormal or we need to change your treatment, we will call you to review the results.  Testing/Procedures: none  Follow-Up:PLEASE SCHEDULE PATIENT TO SEE Dr Curt Bears [facility]) IN April IN PLACE OF Dr Lovena Le appt  At [facility], you and your health needs are our priority.  As part of our continuing mission to provide you with exceptional heart care, we have created designated Provider Care Teams.  These Care Teams include your primary Cardiologist (physician) and Advanced Practice Providers (APPs -  Physician Assistants and Nurse Practitioners) who all work together to provide you with the care you need, when you need it. .   Any Other Special Instructions Will Be Listed Below (If Applicable).

## 2018-07-22 NOTE — Progress Notes (Signed)
Wound check appointment. Steri-strips removed. Wound without redness or edema. Incision edges approximated, wound well healed. Normal device function. Thresholds, sensing, and impedances consistent with implant measurements. Device programmed at 3.5V/auto capture programmed on for extra safety margin until 3 month visit. Histogram distribution appropriate for patient and level of activity. His Bundle capture selective to 2.5V then non selective down to loss of capture at 0.5V. Patient educated about wound care, arm mobility, lifting restrictions. ROV in 3 months with implanting physician. Pt prefers to follow in [place].

## 2018-08-11 ENCOUNTER — Ambulatory Visit (INDEPENDENT_AMBULATORY_CARE_PROVIDER_SITE_OTHER): Payer: Medicare PPO | Admitting: Cardiology

## 2018-08-11 ENCOUNTER — Encounter: Payer: Self-pay | Admitting: Cardiology

## 2018-08-11 VITALS — BP 134/68 | HR 95 | Ht 70.0 in | Wt 188.0 lb

## 2018-08-11 DIAGNOSIS — R001 Bradycardia, unspecified: Secondary | ICD-10-CM | POA: Diagnosis not present

## 2018-08-11 DIAGNOSIS — Z950 Presence of cardiac pacemaker: Secondary | ICD-10-CM | POA: Insufficient documentation

## 2018-08-11 DIAGNOSIS — I10 Essential (primary) hypertension: Secondary | ICD-10-CM

## 2018-08-11 HISTORY — DX: Presence of cardiac pacemaker: Z95.0

## 2018-08-11 HISTORY — DX: Essential (primary) hypertension: I10

## 2018-08-11 NOTE — Patient Instructions (Signed)
Medication Instructions:  Your physician has recommended you make the following change in your medication:   STOP  Taking losartan  If you need a refill on your cardiac medications before your next appointment, please call your pharmacy.   Lab work: NONE  Testing/Procedures: NONE  Follow-Up: At [facility], you and your health needs are our priority.  As part of our continuing mission to provide you with exceptional heart care, we have created designated Provider Care Teams.  These Care Teams include your primary Cardiologist (physician) and Advanced Practice Providers (APPs -  Physician Assistants and Nurse Practitioners) who all work together to provide you with the care you need, when you need it. You will need a follow up appointment in 12 months.  Please call our office 2 months in advance to schedule this appointment.  You may see No primary care provider on file. or another member of our [facility] Provider Team in [place]: Jenne Campus, MD . Jyl Heinz, MD

## 2018-08-11 NOTE — Progress Notes (Signed)
Cardiology Office Note:    Date:  08/11/2018   ID:  Dillon Wallace, DOB Mar 31, 1934, MRN [id number]  PCP:  Lowella Dandy, NP  Cardiologist:  Shirlee More, MD    Referring MD: Lowella Dandy, NP    ASSESSMENT:    1. Bradycardia with 41-50 beats per minute   2. Essential hypertension   3. Pacemaker    PLAN:    In order of problems listed above:  1. He is improved with pacemaker but documented symptomatic bradycardia normal parameters of our device clinic. 2. Blood pressure is normal he prefers to DC has week ARB monitor home blood pressures call if greater than 140 at this time neither of [place] think he needs ongoing treatment for hypertension   Next appointment: 1 year with me should be scheduled device clinic   Medication Adjustments/Labs and Tests Ordered: Current medicines are reviewed at length with the patient today.  Concerns regarding medicines are outlined above.  No orders of the defined types were placed in this encounter.  No orders of the defined types were placed in this encounter.   Chief Complaint  Patient presents with  . Follow-up    after pacemaker for symptomatic heart block    History of Present Illness:    Dillon Wallace is a 85 y.o. male with a hx of near syncope, bradycardia and SVT  last seen by me 06/30/18.He had a pacemaker implantation 07/09/18 for Mobitz 2 second degree AVB symptomatic. Compliance with diet, lifestyle and medications: Yes  He feels better recovered no pacemaker awareness chest pain syncope palpitation.  He questions if he needs antihypertensive therapy with no preceding diagnosis of elevated blood pressure  Echo 07/09/18:  Study Conclusions - Left ventricle: The cavity size was normal. Systolic function was   normal. The estimated ejection fraction was in the range of 60%   to 65%. Wall motion was normal; there were no regional wall   motion abnormalities. - Mitral valve: Calcified annulus. Mildly thickened leaflets .   There was  moderate regurgitation. - Left atrium: The atrium was mildly dilated. - Right atrium: The atrium was mildly dilated. Past Medical History:  Diagnosis Date  . Bradycardia     Past Surgical History:  Procedure Laterality Date  . PACEMAKER IMPLANT N/A 07/09/2018   Procedure: PACEMAKER IMPLANT;  Surgeon: Evans Lance, MD;  Location: [place] [facility];  Service: Cardiovascular;  Laterality: N/A;  . TONSILLECTOMY      Current Medications: Current Meds  Medication Sig  . losartan (COZAAR) 25 MG tablet Take 1 tablet (25 mg total) by mouth daily.  . montelukast (SINGULAIR) 10 MG tablet Take 10 mg by mouth at bedtime.     Allergies:   Penicillins   Social History   Socioeconomic History  . Marital status: Married    Spouse name: Not on file  . Number of children: Not on file  . Years of education: Not on file  . Highest education level: Not on file  Occupational History  . Not on file  Social Needs  . Financial resource strain: Not on file  . Food insecurity:    Worry: Not on file    Inability: Not on file  . Transportation needs:    Medical: Not on file    Non-medical: Not on file  Tobacco Use  . Smoking status: Former Smoker    Packs/day: 1.00    Years: 5.00    Pack years: 5.00    Last attempt to  quit: 06/23/1962    Years since quitting: 56.1  . Smokeless tobacco: Never Used  Substance and Sexual Activity  . Alcohol use: Yes    Comment: occassionally  . Drug use: Never  . Sexual activity: Not on file  Lifestyle  . Physical activity:    Days per week: Not on file    Minutes per session: Not on file  . Stress: Not on file  Relationships  . Social connections:    Talks on phone: Not on file    Gets together: Not on file    Attends religious service: Not on file    Active member of club or organization: Not on file    Attends meetings of clubs or organizations: Not on file    Relationship status: Not on file  Other Topics Concern  . Not on file  Social  History Narrative  . Not on file     Family History: The patient's family history includes COPD in his brother; Heart attack in his father. ROS:   Please see the history of present illness.    All other systems reviewed and are negative.  EKGs/Labs/Other Studies Reviewed:    The following studies were reviewed today  Recent Labs: 07/08/2018: TSH 2.168 07/09/2018: ALT 19; BUN 16; Creatinine, Ser 1.36; Hemoglobin 12.1; Platelets 137; Potassium 4.3; Sodium 138  Recent Lipid Panel No results found for: CHOL, TRIG, HDL, CHOLHDL, VLDL, LDLCALC, LDLDIRECT  Physical Exam:    VS:  BP 134/68 (BP Location: Left Arm, Patient Position: Sitting, Cuff Size: Normal)   Pulse 95   Ht 5\' 10"  (1.778 m)   Wt 188 lb (85.3 kg)   SpO2 98%   BMI 26.98 kg/m     Wt Readings from Last 3 Encounters:  08/11/18 188 lb (85.3 kg)  07/09/18 183 lb 3.2 oz (83.1 kg)  06/30/18 185 lb 2 oz (84 kg)     GEN:  Well nourished, well developed in no acute distress HEENT: Normal NECK: No JVD; No carotid bruits LYMPHATICS: No lymphadenopathy CARDIAC: Pacemaker pocket is healed no effusion or redness RRR, no murmurs, rubs, gallops RESPIRATORY:  Clear to auscultation without rales, wheezing or rhonchi  ABDOMEN: Soft, non-tender, non-distended MUSCULOSKELETAL:  No edema; No deformity  SKIN: Warm and dry NEUROLOGIC:  Alert and oriented x 3 PSYCHIATRIC:  Normal affect    Signed, Shirlee More, MD  08/11/2018 2:45 PM    [facility]

## 2018-10-11 ENCOUNTER — Ambulatory Visit (INDEPENDENT_AMBULATORY_CARE_PROVIDER_SITE_OTHER): Payer: Medicare PPO | Admitting: *Deleted

## 2018-10-11 ENCOUNTER — Other Ambulatory Visit: Payer: Self-pay

## 2018-10-11 ENCOUNTER — Telehealth: Payer: Self-pay | Admitting: *Deleted

## 2018-10-11 DIAGNOSIS — I471 Supraventricular tachycardia: Secondary | ICD-10-CM

## 2018-10-11 LAB — CUP PACEART REMOTE DEVICE CHECK
Battery Remaining Longevity: 124 mo
Battery Voltage: 3.07 V
Brady Statistic AP VP Percent: 4.11 %
Brady Statistic AP VS Percent: 0.01 %
Brady Statistic AS VP Percent: 92.71 %
Brady Statistic AS VS Percent: 3.17 %
Brady Statistic RA Percent Paced: 4.81 %
Brady Statistic RV Percent Paced: 96.82 %
Date Time Interrogation Session: 20200420182204
Implantable Lead Implant Date: 20200117
Implantable Lead Implant Date: 20200117
Implantable Lead Location: 753859
Implantable Lead Location: 753860
Implantable Lead Model: 3830
Implantable Lead Model: 5076
Implantable Pulse Generator Implant Date: 20200117
Lead Channel Impedance Value: 361 Ohm
Lead Channel Impedance Value: 399 Ohm
Lead Channel Impedance Value: 608 Ohm
Lead Channel Impedance Value: 627 Ohm
Lead Channel Pacing Threshold Amplitude: 0.625 V
Lead Channel Pacing Threshold Amplitude: 0.625 V
Lead Channel Pacing Threshold Pulse Width: 0.4 ms
Lead Channel Pacing Threshold Pulse Width: 0.4 ms
Lead Channel Sensing Intrinsic Amplitude: 15.75 mV
Lead Channel Sensing Intrinsic Amplitude: 15.75 mV
Lead Channel Sensing Intrinsic Amplitude: 8.75 mV
Lead Channel Sensing Intrinsic Amplitude: 8.75 mV
Lead Channel Setting Pacing Amplitude: 3 V
Lead Channel Setting Pacing Amplitude: 3 V
Lead Channel Setting Pacing Pulse Width: 0.4 ms
Lead Channel Setting Sensing Sensitivity: 2 mV

## 2018-10-11 NOTE — Telephone Encounter (Signed)
  Concerns and/or Complaints:                    Since your last visit or hospitalization:   1. Have you been having new or worsening chest pain?  NO chest pain. Some discomfort when exercising with person trainer. Feels like a twinge around device when lifting his arms. Has a vibration feel. 2. Have you been having new or worsening shortness of breath? No 3. Have you been having new or worsening leg swelling, wt gain, or increase in abdominal girth (pants fitting more tightly)?  No 4. Have you had any passing out spells? No 5. Have you had any extreme tiredness or fatigue? No   Patient wants to know should he exercise yet? Patient does not have access to do virtual visits.  Only has a landline.

## 2018-10-11 NOTE — Telephone Encounter (Signed)
Virtual Visit Pre-Appointment Phone Call  Steps For Call:  1. Confirm consent - "In the setting of the current Covid19 crisis, you are scheduled for a (phone or video) visit with your provider on (date) at (time).  Just as we do with many in-office visits, in order for you to participate in this visit, we must obtain consent.  If you'd like, I can send this to your mychart (if signed up) or email for you to review.  Otherwise, I can obtain your verbal consent now.  All virtual visits are billed to your insurance company just like a normal visit would be.  By agreeing to a virtual visit, we'd like you to understand that the technology does not allow for your provider to perform an examination, and thus may limit your provider's ability to fully assess your condition. If your provider identifies any concerns that need to be evaluated in person, we will make arrangements to do so.  Finally, though the technology is pretty good, we cannot assure that it will always work on either your or our end, and in the setting of a video visit, we may have to convert it to a phone-only visit.  In either situation, we cannot ensure that we have a secure connection.  Are you willing to proceed?" STAFF: Did the patient verbally acknowledge consent to telehealth visit? Document YES/NO here: Yes  2. Confirm the BEST phone number to call the day of the visit by including in appointment notes  3. Give patient instructions for WebEx/MyChart download to smartphone as below or Doximity/Doxy.me if video visit (depending on what platform provider is using)  4. Advise patient to be prepared with their blood pressure, heart rate, weight, any heart rhythm information, their current medicines, and a piece of paper and pen handy for any instructions they may receive the day of their visit  5. Inform patient they will receive a phone call 15 minutes prior to their appointment time (may be from unknown caller ID) so they should be  prepared to answer  6. Confirm that appointment type is correct in Epic appointment notes (VIDEO vs PHONE)     TELEPHONE CALL NOTE  Dillon Wallace has been deemed a candidate for a follow-up tele-health visit to limit community exposure during the Covid-19 pandemic. I spoke with the patient via phone to ensure availability of phone/video source, confirm preferred email & phone number, and discuss instructions and expectations.  I reminded Dillon Wallace to be prepared with any vital sign and/or heart rhythm information that could potentially be obtained via home monitoring, at the time of his visit. I reminded Dillon Wallace to expect a phone call at the time of his visit if his visit.  Dillon Wallace 10/11/2018 2:27 PM   INSTRUCTIONS FOR DOWNLOADING THE [place] APP TO SMARTPHONE  - If Apple, ask patient to go to [employer] and type in WebEx in the search bar. [place] [employer], the blue/green circle. If Android, go to [employer] and type in [employer] in the search bar. The app is free but as with any other app downloads, their phone may require them to verify saved payment information or Apple/Android password.  - The patient does NOT have to create an account. - On the day of the visit, the assist will walk the patient through joining the meeting with the meeting number/password.  INSTRUCTIONS FOR DOWNLOADING THE MYCHART APP TO SMARTPHONE  - The patient must first make sure to have activated  MyChart and know their login information - If Apple, go to [employer] and type in MyChart in the search bar and download the app. If Android, ask patient to go to [employer] and type in [place] in the search bar and download the app. The app is free but as with any other app downloads, their phone may require them to verify saved payment information or Apple/Android password.  - The patient will need to then log into the app with their MyChart username and password, and select [facility] as  their healthcare provider to link the account. When it is time for your visit, go to the MyChart app, find appointments, and click Begin Video Visit. Be sure to Select Allow for your device to access the Microphone and Camera for your visit. You will then be connected, and your provider will be with you shortly.  **If they have any issues connecting, or need assistance please contact MyChart service desk [phone])**  **If using a computer, in order to ensure the best quality for their visit they will need to use either of the following Internet Browsers: [employer], or Google Chrome**  IF USING DOXIMITY or DOXY.ME - The patient will receive a link just prior to their visit, either by text or email (to be determined day of appointment depending on if it's doxy.me or Doximity).     FULL LENGTH CONSENT FOR TELE-HEALTH VISIT   I hereby voluntarily request, consent and authorize [facility] and its employed or contracted physicians, physician assistants, nurse practitioners or other licensed health care professionals (the Practitioner), to provide me with telemedicine health care services (the "Services") as deemed necessary by the treating Practitioner. I acknowledge and consent to receive the Services by the Practitioner via telemedicine. I understand that the telemedicine visit will involve communicating with the Practitioner through live audiovisual communication technology and the disclosure of certain medical information by electronic transmission. I acknowledge that I have been given the opportunity to request an in-person assessment or other available alternative prior to the telemedicine visit and am voluntarily participating in the telemedicine visit.  I understand that I have the right to withhold or withdraw my consent to the use of telemedicine in the course of my care at any time, without affecting my right to future care or treatment, and that the Practitioner or I  may terminate the telemedicine visit at any time. I understand that I have the right to inspect all information obtained and/or recorded in the course of the telemedicine visit and may receive copies of available information for a reasonable fee.  I understand that some of the potential risks of receiving the Services via telemedicine include:  Marland Kitchen Delay or interruption in medical evaluation due to technological equipment failure or disruption; . Information transmitted may not be sufficient (e.g. poor resolution of images) to allow for appropriate medical decision making by the Practitioner; and/or  . In rare instances, security protocols could fail, causing a breach of personal health information.  Furthermore, I acknowledge that it is my responsibility to provide information about my medical history, conditions and care that is complete and accurate to the best of my ability. I acknowledge that Practitioner's advice, recommendations, and/or decision may be based on factors not within their control, such as incomplete or inaccurate data provided by me or distortions of diagnostic images or specimens that may result from electronic transmissions. I understand that the practice of medicine is not an exact science and that Practitioner  makes no warranties or guarantees regarding treatment outcomes. I acknowledge that I will receive a copy of this consent concurrently upon execution via email to the email address I last provided but may also request a printed copy by calling the office of [facility].    I understand that my insurance will be billed for this visit.   I have read or had this consent read to me. . I understand the contents of this consent, which adequately explains the benefits and risks of the Services being provided via telemedicine.  . I have been provided ample opportunity to ask questions regarding this consent and the Services and have had my questions answered to my satisfaction. . I  give my informed consent for the services to be provided through the use of telemedicine in my medical care  By participating in this telemedicine visit I agree to the above. Patient verbally consent to Telephone visit.

## 2018-10-12 ENCOUNTER — Encounter: Payer: Medicare PPO | Admitting: Internal Medicine

## 2018-10-12 NOTE — Telephone Encounter (Signed)
Followed up with pt. Informed pt that Dr. Curt Bears will discuss his twinge feeling at visit Monday. Pt agreeable. Phone visit scheduled for Monday

## 2018-10-12 NOTE — Telephone Encounter (Signed)

## 2018-10-18 ENCOUNTER — Other Ambulatory Visit: Payer: Self-pay

## 2018-10-18 ENCOUNTER — Telehealth (INDEPENDENT_AMBULATORY_CARE_PROVIDER_SITE_OTHER): Payer: Medicare PPO | Admitting: Cardiology

## 2018-10-18 ENCOUNTER — Encounter: Payer: Self-pay | Admitting: Cardiology

## 2018-10-18 DIAGNOSIS — Z950 Presence of cardiac pacemaker: Secondary | ICD-10-CM | POA: Diagnosis not present

## 2018-10-18 NOTE — Progress Notes (Signed)
Electrophysiology TeleHealth Note   Due to national recommendations of social distancing due to COVID 19, an audio/video telehealth visit is felt to be most appropriate for this patient at this time.  See Epic message for the patient's consent to telehealth for [facility].   Date:  10/18/2018   ID:  Dillon Wallace, DOB Oct 17, 1933, MRN [id number]  Location: patient's home  Provider location: [address] [place]  Evaluation Performed: Follow-up visit  PCP:  Lowella Dandy, NP  Cardiologist:  [facility] Electrophysiologist:  Dr Curt Bears  Chief Complaint:  bradycardia  History of Present Illness:    Dillon Wallace is a 85 y.o. male who presents via audio/video conferencing for a telehealth visit today.  Since last being seen in our clinic, the patient reports doing very well.  Today, he denies symptoms of palpitations, chest pain, shortness of breath,  lower extremity edema, dizziness, presyncope, or syncope.  The patient is otherwise without complaint today.  The patient denies symptoms of fevers, chills, cough, or new SOB worrisome for COVID 19.  History of mobitz II AV block now s/p Medtronic dual chamber pacemaker by Dr. Lovena Le implanted 07/09/18.  Today, denies symptoms of palpitations, chest pain, shortness of breath, orthopnea, PND, lower extremity edema, claudication, dizziness, presyncope, syncope, bleeding, or neurologic sequela. The patient is tolerating medications without difficulties.  Overall he is feeling well.  He has no chest pain or shortness of breath.  He is able to do all of his daily activities.  He does complain of some numbness and tingling at times in his left arm when he is trying to go to sleep.  He also at times gets shooting pains which are very short lived down to his left hand.  Otherwise he is doing well without major complaint.  Past Medical History:  Diagnosis Date  . Bradycardia     Past Surgical History:  Procedure Laterality Date  .  PACEMAKER IMPLANT N/A 07/09/2018   Procedure: PACEMAKER IMPLANT;  Surgeon: Evans Lance, MD;  Location: [facility];  Service: Cardiovascular;  Laterality: N/A;  . TONSILLECTOMY      Current Outpatient Medications  Medication Sig Dispense Refill  . montelukast (SINGULAIR) 10 MG tablet Take 10 mg by mouth at bedtime.     No current facility-administered medications for this visit.     Allergies:   Penicillins   Social History:  The patient  reports that he quit smoking about 56 years ago. He has a 5.00 pack-year smoking history. He has never used smokeless tobacco. He reports current alcohol use. He reports that he does not use drugs.   Family History:  The patient's  family history includes COPD in his brother; Heart attack in his father.   ROS:  Please see the history of present illness.   All other systems are personally reviewed and negative.    Exam:    Vital Signs:  There were no vitals taken for this visit.  Over the phone, no acute distress, no shortness of breath.  Labs/Other Tests and Data Reviewed:    Recent Labs: 07/08/2018: TSH 2.168 07/09/2018: ALT 19; BUN 16; Creatinine, Ser 1.36; Hemoglobin 12.1; Platelets 137; Potassium 4.3; Sodium 138   Wt Readings from Last 3 Encounters:  08/11/18 188 lb (85.3 kg)  07/09/18 183 lb 3.2 oz (83.1 kg)  06/30/18 185 lb 2 oz (84 kg)     Other studies personally reviewed: Additional studies/ records that were reviewed today include: ECG  07/22/2018 personally reviewed Review of the above records today demonstrates: Sinus rhythm, ventricular pacing  Last device remote is reviewed from [facility] PDF dated 10/11/18 which reveals normal device function, Short AF and nonsustained VT episodes, longest AF 34 minutes.   ASSESSMENT & PLAN:    1.  Mobitz II AV block: S/p medtronic pacemaker implanted 07/09/18.  Device is functioning appropriately.  He has had some short likely atrial flutter versus SVT episodes as well as a 6 beat run  of nonsustained VT.  He has been minimally symptomatic.  No anticoagulation at this time.  Device otherwise functioning appropriately.  He is having some discomfort in his left arm.  I have told him that this may take a while to improve.  2. Hypertension: Has not checked his blood pressure.  Plan per primary cardiology   COVID 19 screen The patient denies symptoms of COVID 19 at this time.  The importance of social distancing was discussed today.  Follow-up: 6 months  This visit was done as a telephone visit.  The patient has a landline and does not have a computer to assist with a video visit.  Current medicines are reviewed at length with the patient today.   The patient does not have concerns regarding his medicines.  The following changes were made today:  none  Labs/ tests ordered today include:  No orders of the defined types were placed in this encounter.    Patient Risk:  after full review of this patients clinical status, I feel that they are at moderate risk at this time.  Today, I have spent 12 minutes with the patient with telehealth technology discussing heart block, pacemaker.    Signed, Savion Washam Meredith Leeds, MD  10/18/2018 8:34 AM     [facility] [address] [place] [address] [phone] (office) [phone] (fax)

## 2018-10-19 ENCOUNTER — Encounter: Payer: Self-pay | Admitting: Cardiology

## 2018-10-19 NOTE — Progress Notes (Signed)
Remote pacemaker transmission.   

## 2019-01-10 ENCOUNTER — Ambulatory Visit (INDEPENDENT_AMBULATORY_CARE_PROVIDER_SITE_OTHER): Payer: Medicare PPO | Admitting: *Deleted

## 2019-01-10 DIAGNOSIS — I471 Supraventricular tachycardia: Secondary | ICD-10-CM | POA: Diagnosis not present

## 2019-01-11 ENCOUNTER — Telehealth: Payer: Self-pay

## 2019-01-11 NOTE — Telephone Encounter (Signed)
Left message for patient to remind of missed remote transmission.  

## 2019-01-13 ENCOUNTER — Telehealth: Payer: Self-pay

## 2019-01-13 NOTE — Telephone Encounter (Signed)
Pt agreed to send transmission with his home monitor.

## 2019-01-19 LAB — CUP PACEART REMOTE DEVICE CHECK
Battery Remaining Longevity: 137 mo
Battery Voltage: 3.05 V
Brady Statistic AP VP Percent: 1.3 %
Brady Statistic AP VS Percent: 0.24 %
Brady Statistic AS VP Percent: 81.77 %
Brady Statistic AS VS Percent: 16.69 %
Brady Statistic RA Percent Paced: 2 %
Brady Statistic RV Percent Paced: 83.07 %
Date Time Interrogation Session: 20200723152014
Implantable Lead Implant Date: 20200117
Implantable Lead Implant Date: 20200117
Implantable Lead Location: 753859
Implantable Lead Location: 753860
Implantable Lead Model: 3830
Implantable Lead Model: 5076
Implantable Pulse Generator Implant Date: 20200117
Lead Channel Impedance Value: 361 Ohm
Lead Channel Impedance Value: 437 Ohm
Lead Channel Impedance Value: 532 Ohm
Lead Channel Impedance Value: 589 Ohm
Lead Channel Pacing Threshold Amplitude: 0.625 V
Lead Channel Pacing Threshold Amplitude: 0.75 V
Lead Channel Pacing Threshold Pulse Width: 0.4 ms
Lead Channel Pacing Threshold Pulse Width: 0.4 ms
Lead Channel Sensing Intrinsic Amplitude: 16.125 mV
Lead Channel Sensing Intrinsic Amplitude: 16.125 mV
Lead Channel Sensing Intrinsic Amplitude: 8.25 mV
Lead Channel Sensing Intrinsic Amplitude: 8.25 mV
Lead Channel Setting Pacing Amplitude: 1.5 V
Lead Channel Setting Pacing Amplitude: 2.5 V
Lead Channel Setting Pacing Pulse Width: 0.4 ms
Lead Channel Setting Sensing Sensitivity: 2 mV

## 2019-01-25 ENCOUNTER — Encounter: Payer: Self-pay | Admitting: Cardiology

## 2019-01-25 NOTE — Progress Notes (Signed)
Remote pacemaker transmission.   

## 2019-04-11 ENCOUNTER — Ambulatory Visit (INDEPENDENT_AMBULATORY_CARE_PROVIDER_SITE_OTHER): Payer: Medicare PPO | Admitting: *Deleted

## 2019-04-11 DIAGNOSIS — R55 Syncope and collapse: Secondary | ICD-10-CM

## 2019-04-11 DIAGNOSIS — I471 Supraventricular tachycardia: Secondary | ICD-10-CM

## 2019-04-12 LAB — CUP PACEART REMOTE DEVICE CHECK
Battery Remaining Longevity: 134 mo
Battery Voltage: 3.03 V
Brady Statistic AP VP Percent: 1.07 %
Brady Statistic AP VS Percent: 0.26 %
Brady Statistic AS VP Percent: 84.75 %
Brady Statistic AS VS Percent: 13.93 %
Brady Statistic RA Percent Paced: 1.75 %
Brady Statistic RV Percent Paced: 85.82 %
Date Time Interrogation Session: 20201019061109
Implantable Lead Implant Date: 20200117
Implantable Lead Implant Date: 20200117
Implantable Lead Location: 753859
Implantable Lead Location: 753860
Implantable Lead Model: 3830
Implantable Lead Model: 5076
Implantable Pulse Generator Implant Date: 20200117
Lead Channel Impedance Value: 361 Ohm
Lead Channel Impedance Value: 418 Ohm
Lead Channel Impedance Value: 570 Ohm
Lead Channel Impedance Value: 589 Ohm
Lead Channel Pacing Threshold Amplitude: 0.75 V
Lead Channel Pacing Threshold Amplitude: 0.75 V
Lead Channel Pacing Threshold Pulse Width: 0.4 ms
Lead Channel Pacing Threshold Pulse Width: 0.4 ms
Lead Channel Sensing Intrinsic Amplitude: 13.375 mV
Lead Channel Sensing Intrinsic Amplitude: 13.375 mV
Lead Channel Sensing Intrinsic Amplitude: 7 mV
Lead Channel Sensing Intrinsic Amplitude: 7 mV
Lead Channel Setting Pacing Amplitude: 1.5 V
Lead Channel Setting Pacing Amplitude: 2.5 V
Lead Channel Setting Pacing Pulse Width: 0.4 ms
Lead Channel Setting Sensing Sensitivity: 2 mV

## 2019-04-14 DIAGNOSIS — D485 Neoplasm of uncertain behavior of skin: Secondary | ICD-10-CM | POA: Diagnosis not present

## 2019-04-14 DIAGNOSIS — L820 Inflamed seborrheic keratosis: Secondary | ICD-10-CM | POA: Diagnosis not present

## 2019-04-14 DIAGNOSIS — L570 Actinic keratosis: Secondary | ICD-10-CM | POA: Diagnosis not present

## 2019-04-14 DIAGNOSIS — L720 Epidermal cyst: Secondary | ICD-10-CM | POA: Diagnosis not present

## 2019-04-14 DIAGNOSIS — D1801 Hemangioma of skin and subcutaneous tissue: Secondary | ICD-10-CM | POA: Diagnosis not present

## 2019-04-14 DIAGNOSIS — L821 Other seborrheic keratosis: Secondary | ICD-10-CM | POA: Diagnosis not present

## 2019-04-14 DIAGNOSIS — D225 Melanocytic nevi of trunk: Secondary | ICD-10-CM | POA: Diagnosis not present

## 2019-04-14 DIAGNOSIS — L905 Scar conditions and fibrosis of skin: Secondary | ICD-10-CM | POA: Diagnosis not present

## 2019-04-14 DIAGNOSIS — Z85828 Personal history of other malignant neoplasm of skin: Secondary | ICD-10-CM | POA: Diagnosis not present

## 2019-04-28 DIAGNOSIS — L570 Actinic keratosis: Secondary | ICD-10-CM | POA: Diagnosis not present

## 2019-05-02 NOTE — Progress Notes (Signed)
Remote pacemaker transmission.   

## 2019-07-11 ENCOUNTER — Ambulatory Visit (INDEPENDENT_AMBULATORY_CARE_PROVIDER_SITE_OTHER): Payer: Medicare PPO | Admitting: *Deleted

## 2019-07-11 DIAGNOSIS — R001 Bradycardia, unspecified: Secondary | ICD-10-CM

## 2019-07-11 LAB — CUP PACEART REMOTE DEVICE CHECK
Battery Remaining Longevity: 131 mo
Battery Voltage: 3.02 V
Brady Statistic AP VP Percent: 0.53 %
Brady Statistic AP VS Percent: 0.13 %
Brady Statistic AS VP Percent: 86.2 %
Brady Statistic AS VS Percent: 13.14 %
Brady Statistic RA Percent Paced: 0.9 %
Brady Statistic RV Percent Paced: 86.73 %
Date Time Interrogation Session: 20210118002255
Implantable Lead Implant Date: 20200117
Implantable Lead Implant Date: 20200117
Implantable Lead Location: 753859
Implantable Lead Location: 753860
Implantable Lead Model: 3830
Implantable Lead Model: 5076
Implantable Pulse Generator Implant Date: 20200117
Lead Channel Impedance Value: 361 Ohm
Lead Channel Impedance Value: 437 Ohm
Lead Channel Impedance Value: 513 Ohm
Lead Channel Impedance Value: 570 Ohm
Lead Channel Pacing Threshold Amplitude: 0.5 V
Lead Channel Pacing Threshold Amplitude: 0.625 V
Lead Channel Pacing Threshold Pulse Width: 0.4 ms
Lead Channel Pacing Threshold Pulse Width: 0.4 ms
Lead Channel Sensing Intrinsic Amplitude: 15.125 mV
Lead Channel Sensing Intrinsic Amplitude: 15.125 mV
Lead Channel Sensing Intrinsic Amplitude: 8.5 mV
Lead Channel Sensing Intrinsic Amplitude: 8.5 mV
Lead Channel Setting Pacing Amplitude: 1.5 V
Lead Channel Setting Pacing Amplitude: 2.5 V
Lead Channel Setting Pacing Pulse Width: 0.4 ms
Lead Channel Setting Sensing Sensitivity: 2 mV

## 2019-07-12 DIAGNOSIS — Z79899 Other long term (current) drug therapy: Secondary | ICD-10-CM | POA: Diagnosis not present

## 2019-07-12 DIAGNOSIS — Z6826 Body mass index (BMI) 26.0-26.9, adult: Secondary | ICD-10-CM | POA: Diagnosis not present

## 2019-07-12 DIAGNOSIS — E785 Hyperlipidemia, unspecified: Secondary | ICD-10-CM | POA: Diagnosis not present

## 2019-07-12 DIAGNOSIS — Z950 Presence of cardiac pacemaker: Secondary | ICD-10-CM | POA: Diagnosis not present

## 2019-07-12 DIAGNOSIS — J309 Allergic rhinitis, unspecified: Secondary | ICD-10-CM | POA: Diagnosis not present

## 2019-07-12 NOTE — Progress Notes (Signed)
PPM remote 

## 2019-08-29 DIAGNOSIS — D485 Neoplasm of uncertain behavior of skin: Secondary | ICD-10-CM | POA: Diagnosis not present

## 2019-08-29 DIAGNOSIS — L853 Xerosis cutis: Secondary | ICD-10-CM | POA: Diagnosis not present

## 2019-08-29 DIAGNOSIS — L905 Scar conditions and fibrosis of skin: Secondary | ICD-10-CM | POA: Diagnosis not present

## 2019-08-29 DIAGNOSIS — D1801 Hemangioma of skin and subcutaneous tissue: Secondary | ICD-10-CM | POA: Diagnosis not present

## 2019-08-29 DIAGNOSIS — L814 Other melanin hyperpigmentation: Secondary | ICD-10-CM | POA: Diagnosis not present

## 2019-08-29 DIAGNOSIS — L821 Other seborrheic keratosis: Secondary | ICD-10-CM | POA: Diagnosis not present

## 2019-08-29 DIAGNOSIS — L820 Inflamed seborrheic keratosis: Secondary | ICD-10-CM | POA: Diagnosis not present

## 2019-08-29 DIAGNOSIS — L570 Actinic keratosis: Secondary | ICD-10-CM | POA: Diagnosis not present

## 2019-08-29 DIAGNOSIS — D229 Melanocytic nevi, unspecified: Secondary | ICD-10-CM | POA: Diagnosis not present

## 2019-08-29 DIAGNOSIS — Z85828 Personal history of other malignant neoplasm of skin: Secondary | ICD-10-CM | POA: Diagnosis not present

## 2019-09-05 DIAGNOSIS — J309 Allergic rhinitis, unspecified: Secondary | ICD-10-CM | POA: Diagnosis not present

## 2019-09-05 DIAGNOSIS — Z6826 Body mass index (BMI) 26.0-26.9, adult: Secondary | ICD-10-CM | POA: Diagnosis not present

## 2019-09-05 DIAGNOSIS — E785 Hyperlipidemia, unspecified: Secondary | ICD-10-CM | POA: Diagnosis not present

## 2019-09-05 DIAGNOSIS — Z950 Presence of cardiac pacemaker: Secondary | ICD-10-CM | POA: Diagnosis not present

## 2019-09-05 DIAGNOSIS — I10 Essential (primary) hypertension: Secondary | ICD-10-CM | POA: Diagnosis not present

## 2019-09-22 ENCOUNTER — Ambulatory Visit: Payer: Medicare PPO | Admitting: Cardiology

## 2019-09-22 ENCOUNTER — Other Ambulatory Visit: Payer: Self-pay

## 2019-09-22 ENCOUNTER — Encounter: Payer: Self-pay | Admitting: Cardiology

## 2019-09-22 VITALS — BP 112/62 | HR 85 | Ht 70.0 in | Wt 190.0 lb

## 2019-09-22 DIAGNOSIS — I471 Supraventricular tachycardia: Secondary | ICD-10-CM

## 2019-09-22 DIAGNOSIS — R001 Bradycardia, unspecified: Secondary | ICD-10-CM | POA: Diagnosis not present

## 2019-09-22 DIAGNOSIS — Z950 Presence of cardiac pacemaker: Secondary | ICD-10-CM | POA: Diagnosis not present

## 2019-09-22 MED ORDER — ASPIRIN EC 81 MG PO TBEC: 81.0000 mg | DELAYED_RELEASE_TABLET | Freq: Every day | ORAL | 3 refills | Status: DC

## 2019-09-22 NOTE — Addendum Note (Signed)
Addended by: Resa Miner I on: 09/22/2019 03:56 PM   Modules accepted: Orders

## 2019-09-22 NOTE — Progress Notes (Signed)
Cardiology Office Note:    Date:  09/22/2019   ID:  Dillon Wallace, DOB 06-29-1933, MRN [id number]  PCP:  Lowella Dandy, NP  Cardiologist:  Shirlee More, MD    Referring MD: Lowella Dandy, NP    ASSESSMENT:    1. Bradycardia with 41-50 beats per minute   2. Pacemaker   3. PSVT (paroxysmal supraventricular tachycardia) (HCC)    PLAN:    In order of problems listed above:  1. He has done exceptionally well with pacemaker therapy asymptomatic continue to follow in our device clinic.  He has had no clinical recurrence of his SVT.  He takes no cardiovascular medications but does want to start taking low-dose aspirin coated 81 mg daily and declines lipid-lowering treatment at this time.  Plan to see him back in cardiology follow-up in 1 year and if he has breakthrough tachyarrhythmias would benefit from suppressive treatment.  We will continue to follow his device by telemetry especially for markers of atrial fibrillation.   Next appointment: 1 year   Medication Adjustments/Labs and Tests Ordered: Current medicines are reviewed at length with the patient today.  Concerns regarding medicines are outlined above.  No orders of the defined types were placed in this encounter.  No orders of the defined types were placed in this encounter.   Chief Complaint  Patient presents with  . Follow-up  . Bradycardia  . Loss of Consciousness    History of Present Illness:    Dillon Wallace is a 86 y.o. male with a hx of bradycardia with second-degree AV block and permanent pacemaker and SVT last seen 08/11/2018.  He is followed in our device clinic his last download 07/12/2019 showed normal leads and battery function and 17 episodes of brief SVT.  He is RV paced 87% of the time.  He does not have documented atrial fibrillation. Compliance with diet, lifestyle and medications: Yes  He is pleased with the quality of his life active and golfs has no pacemaker awareness and has had no recurrent episodes of  syncope or near syncope.  We discussed general cardiovascular prophylaxis and if there is benefit of low-dose aspirin and he will start daily.  We discussed his lipids I told her it may be value in treating him and he is decided not to take lipid-lowering therapy I cannot argue and I do not think it would improve the quality of his life overall.  He has had no chest pain shortness of breath syncope or TIA Past Medical History:  Diagnosis Date  . Bradycardia     Past Surgical History:  Procedure Laterality Date  . PACEMAKER IMPLANT N/A 07/09/2018   Procedure: PACEMAKER IMPLANT;  Surgeon: Evans Lance, MD;  Location: [facility];  Service: Cardiovascular;  Laterality: N/A;  . TONSILLECTOMY      Current Medications: Current Meds  Medication Sig  . montelukast (SINGULAIR) 10 MG tablet Take 10 mg by mouth at bedtime.     Allergies:   Penicillins   Social History   Socioeconomic History  . Marital status: Married    Spouse name: Not on file  . Number of children: Not on file  . Years of education: Not on file  . Highest education level: Not on file  Occupational History  . Not on file  Tobacco Use  . Smoking status: Former Smoker    Packs/day: 1.00    Years: 5.00    Pack years: 5.00    Quit date: 06/23/1962  Years since quitting: 57.2  . Smokeless tobacco: Never Used  Substance and Sexual Activity  . Alcohol use: Yes    Comment: occassionally  . Drug use: Never  . Sexual activity: Not on file  Other Topics Concern  . Not on file  Social History Narrative  . Not on file   Social Determinants of Health   Financial Resource Strain:   . Difficulty of Paying Living Expenses:   Food Insecurity:   . Worried About Charity fundraiser in the Last Year:   . Arboriculturist in the Last Year:   Transportation Needs:   . Film/video editor (Medical):   Marland Kitchen Lack of Transportation (Non-Medical):   Physical Activity:   . Days of Exercise per Week:   . Minutes of  Exercise per Session:   Stress:   . Feeling of Stress :   Social Connections:   . Frequency of Communication with Friends and Family:   . Frequency of Social Gatherings with Friends and Family:   . Attends Religious Services:   . Active Member of Clubs or Organizations:   . Attends Archivist Meetings:   Marland Kitchen Marital Status:      Family History: The patient's family history includes COPD in his brother; Heart attack in his father. ROS:   Please see the history of present illness.    All other systems reviewed and are negative.  EKGs/Labs/Other Studies Reviewed:    The following studies were reviewed today:    Recent Labs: 07/12/2019 cholesterol 208 HDL 49 LDL 143 creatinine 1.22 TSH normal  Physical Exam:    VS:  BP 112/62   Pulse 85   Ht 5\' 10"  (1.778 m)   Wt 190 lb (86.2 kg)   SpO2 97%   BMI 27.26 kg/m     Wt Readings from Last 3 Encounters:  09/22/19 190 lb (86.2 kg)  08/11/18 188 lb (85.3 kg)  07/09/18 183 lb 3.2 oz (83.1 kg)     GEN:  Well nourished, well developed in no acute distress HEENT: Normal NECK: No JVD; No carotid bruits LYMPHATICS: No lymphadenopathy CARDIAC: RRR, no murmurs, rubs, gallops RESPIRATORY:  Clear to auscultation without rales, wheezing or rhonchi  ABDOMEN: Soft, non-tender, non-distended MUSCULOSKELETAL:  No edema; No deformity  SKIN: Warm and dry NEUROLOGIC:  Alert and oriented x 3 PSYCHIATRIC:  Normal affect    Signed, Shirlee More, MD  09/22/2019 3:53 PM    [facility]

## 2019-09-22 NOTE — Patient Instructions (Signed)
Medication Instructions:  Your physician has recommended you make the following change in your medication:  START: Aspirin 81 mg take one tablet by mouth daily.  *If you need a refill on your cardiac medications before your next appointment, please call your pharmacy*   Lab Work: None If you have labs (blood work) drawn today and your tests are completely normal, you will receive your results only by: MyChart Message (if you have MyChart) OR A paper copy in the mail If you have any lab test that is abnormal or we need to change your treatment, we will call you to review the results.   Testing/Procedures: None   Follow-Up: At [facility], you and your health needs are our priority.  As part of our continuing mission to provide you with exceptional heart care, we have created designated Provider Care Teams.  These Care Teams include your primary Cardiologist (physician) and Advanced Practice Providers (APPs -  Physician Assistants and Nurse Practitioners) who all work together to provide you with the care you need, when you need it.  We recommend signing up for the patient portal called "MyChart".  Sign up information is provided on this After Visit Summary.  MyChart is used to connect with patients for Virtual Visits (Telemedicine).  Patients are able to view lab/test results, encounter notes, upcoming appointments, etc.  Non-urgent messages can be sent to your provider as well.   To learn more about what you can do with MyChart, go to [email or web address].    Your next appointment:   1 year(s)  The format for your next appointment:   In Person  Provider:   Brian Munley, MD    Other Instructions   

## 2019-10-10 ENCOUNTER — Ambulatory Visit (INDEPENDENT_AMBULATORY_CARE_PROVIDER_SITE_OTHER): Payer: Medicare PPO | Admitting: *Deleted

## 2019-10-10 DIAGNOSIS — R001 Bradycardia, unspecified: Secondary | ICD-10-CM | POA: Diagnosis not present

## 2019-10-11 LAB — CUP PACEART REMOTE DEVICE CHECK
Battery Remaining Longevity: 126 mo
Battery Voltage: 3.02 V
Brady Statistic AP VP Percent: 1.39 %
Brady Statistic AP VS Percent: 0.1 %
Brady Statistic AS VP Percent: 92.35 %
Brady Statistic AS VS Percent: 6.16 %
Brady Statistic RA Percent Paced: 1.83 %
Brady Statistic RV Percent Paced: 93.74 %
Date Time Interrogation Session: 20210419125401
Implantable Lead Implant Date: 20200117
Implantable Lead Implant Date: 20200117
Implantable Lead Location: 753859
Implantable Lead Location: 753860
Implantable Lead Model: 3830
Implantable Lead Model: 5076
Implantable Pulse Generator Implant Date: 20200117
Lead Channel Impedance Value: 342 Ohm
Lead Channel Impedance Value: 399 Ohm
Lead Channel Impedance Value: 551 Ohm
Lead Channel Impedance Value: 570 Ohm
Lead Channel Pacing Threshold Amplitude: 0.625 V
Lead Channel Pacing Threshold Amplitude: 0.625 V
Lead Channel Pacing Threshold Pulse Width: 0.4 ms
Lead Channel Pacing Threshold Pulse Width: 0.4 ms
Lead Channel Sensing Intrinsic Amplitude: 17.875 mV
Lead Channel Sensing Intrinsic Amplitude: 17.875 mV
Lead Channel Sensing Intrinsic Amplitude: 8 mV
Lead Channel Sensing Intrinsic Amplitude: 8 mV
Lead Channel Setting Pacing Amplitude: 1.5 V
Lead Channel Setting Pacing Amplitude: 2.5 V
Lead Channel Setting Pacing Pulse Width: 0.4 ms
Lead Channel Setting Sensing Sensitivity: 2 mV

## 2019-11-14 DIAGNOSIS — J309 Allergic rhinitis, unspecified: Secondary | ICD-10-CM | POA: Diagnosis not present

## 2019-11-14 DIAGNOSIS — E785 Hyperlipidemia, unspecified: Secondary | ICD-10-CM | POA: Diagnosis not present

## 2019-11-14 DIAGNOSIS — Z79899 Other long term (current) drug therapy: Secondary | ICD-10-CM | POA: Diagnosis not present

## 2019-11-14 DIAGNOSIS — Z139 Encounter for screening, unspecified: Secondary | ICD-10-CM | POA: Diagnosis not present

## 2019-11-14 DIAGNOSIS — Z950 Presence of cardiac pacemaker: Secondary | ICD-10-CM | POA: Diagnosis not present

## 2019-11-14 DIAGNOSIS — Z6826 Body mass index (BMI) 26.0-26.9, adult: Secondary | ICD-10-CM | POA: Diagnosis not present

## 2019-12-16 DIAGNOSIS — Z6827 Body mass index (BMI) 27.0-27.9, adult: Secondary | ICD-10-CM | POA: Diagnosis not present

## 2019-12-16 DIAGNOSIS — R5383 Other fatigue: Secondary | ICD-10-CM | POA: Diagnosis not present

## 2019-12-16 DIAGNOSIS — Z125 Encounter for screening for malignant neoplasm of prostate: Secondary | ICD-10-CM | POA: Diagnosis not present

## 2019-12-16 DIAGNOSIS — R5381 Other malaise: Secondary | ICD-10-CM | POA: Diagnosis not present

## 2019-12-16 DIAGNOSIS — E559 Vitamin D deficiency, unspecified: Secondary | ICD-10-CM | POA: Diagnosis not present

## 2019-12-16 DIAGNOSIS — H35341 Macular cyst, hole, or pseudohole, right eye: Secondary | ICD-10-CM | POA: Diagnosis not present

## 2019-12-16 DIAGNOSIS — H35349 Macular cyst, hole, or pseudohole, unspecified eye: Secondary | ICD-10-CM | POA: Diagnosis not present

## 2020-01-09 ENCOUNTER — Ambulatory Visit (INDEPENDENT_AMBULATORY_CARE_PROVIDER_SITE_OTHER): Payer: Medicare PPO | Admitting: *Deleted

## 2020-01-09 DIAGNOSIS — R001 Bradycardia, unspecified: Secondary | ICD-10-CM | POA: Diagnosis not present

## 2020-01-09 LAB — CUP PACEART REMOTE DEVICE CHECK
Battery Remaining Longevity: 123 mo
Battery Voltage: 3.01 V
Brady Statistic AP VP Percent: 1.43 %
Brady Statistic AP VS Percent: 0 %
Brady Statistic AS VP Percent: 95.23 %
Brady Statistic AS VS Percent: 3.34 %
Brady Statistic RA Percent Paced: 1.7 %
Brady Statistic RV Percent Paced: 96.66 %
Date Time Interrogation Session: 20210719143623
Implantable Lead Implant Date: 20200117
Implantable Lead Implant Date: 20200117
Implantable Lead Location: 753859
Implantable Lead Location: 753860
Implantable Lead Model: 3830
Implantable Lead Model: 5076
Implantable Pulse Generator Implant Date: 20200117
Lead Channel Impedance Value: 342 Ohm
Lead Channel Impedance Value: 418 Ohm
Lead Channel Impedance Value: 532 Ohm
Lead Channel Impedance Value: 551 Ohm
Lead Channel Pacing Threshold Amplitude: 0.625 V
Lead Channel Pacing Threshold Amplitude: 0.625 V
Lead Channel Pacing Threshold Pulse Width: 0.4 ms
Lead Channel Pacing Threshold Pulse Width: 0.4 ms
Lead Channel Sensing Intrinsic Amplitude: 17.875 mV
Lead Channel Sensing Intrinsic Amplitude: 17.875 mV
Lead Channel Sensing Intrinsic Amplitude: 8.375 mV
Lead Channel Sensing Intrinsic Amplitude: 8.375 mV
Lead Channel Setting Pacing Amplitude: 1.5 V
Lead Channel Setting Pacing Amplitude: 2.5 V
Lead Channel Setting Pacing Pulse Width: 0.4 ms
Lead Channel Setting Sensing Sensitivity: 2 mV

## 2020-01-10 NOTE — Telephone Encounter (Signed)
This encounter was created in error - please disregard.

## 2020-01-11 ENCOUNTER — Telehealth: Payer: Self-pay

## 2020-01-11 NOTE — Telephone Encounter (Signed)
Pt returned call, advised of MD recommendation for AF clinic.  Pt agreeable.

## 2020-01-11 NOTE — Telephone Encounter (Signed)
-----   Message from Evans Lance, MD sent at 01/10/2020  9:05 PM EDT ----- Refer to atrial fib for initiation of [facility]. Thx. GT ----- Message ----- From: York Ram, RN Sent: 01/10/2020   8:43 AM EDT To: Evans Lance, MD  Patient not on [facility]

## 2020-01-11 NOTE — Telephone Encounter (Signed)
Attempted to reach pt to provide MD recommendation for AF clinic.  No answer, LVM with DC phone # and hours to return call.    PM remote transmission-- Scheduled remote reviewed. Normal device function. 51 AMS, longest 38 minutes; burden 0.3% since April 2021; atrial rates 150s. Burden increasing.

## 2020-01-11 NOTE — Telephone Encounter (Signed)
Called and spoke with Irish Elders, EC on file.  She is aware of appt 01/12/20 @10  am with Adline Peals, PA.  Directions and phone number given for AFib Clinic.

## 2020-01-11 NOTE — Progress Notes (Signed)
Remote pacemaker transmission.   

## 2020-01-12 ENCOUNTER — Other Ambulatory Visit: Payer: Self-pay

## 2020-01-12 ENCOUNTER — Ambulatory Visit (HOSPITAL_COMMUNITY)
Admission: RE | Admit: 2020-01-12 | Discharge: 2020-01-12 | Disposition: A | Discharge status: Home / Self Care | Payer: Medicare PPO | Source: Ambulatory Visit | Attending: Physician Assistant | Admitting: Physician Assistant

## 2020-01-12 ENCOUNTER — Encounter (HOSPITAL_COMMUNITY): Payer: Self-pay | Admitting: Physician Assistant

## 2020-01-12 VITALS — BP 150/86 | HR 85 | Ht 70.0 in | Wt 187.2 lb

## 2020-01-12 DIAGNOSIS — Z950 Presence of cardiac pacemaker: Secondary | ICD-10-CM | POA: Insufficient documentation

## 2020-01-12 DIAGNOSIS — R0683 Snoring: Secondary | ICD-10-CM | POA: Diagnosis not present

## 2020-01-12 DIAGNOSIS — I471 Supraventricular tachycardia: Secondary | ICD-10-CM | POA: Insufficient documentation

## 2020-01-12 DIAGNOSIS — I10 Essential (primary) hypertension: Secondary | ICD-10-CM | POA: Insufficient documentation

## 2020-01-12 DIAGNOSIS — I480 Paroxysmal atrial fibrillation: Secondary | ICD-10-CM

## 2020-01-12 DIAGNOSIS — Z7901 Long term (current) use of anticoagulants: Secondary | ICD-10-CM | POA: Diagnosis not present

## 2020-01-12 DIAGNOSIS — R400 Somnolence: Secondary | ICD-10-CM | POA: Insufficient documentation

## 2020-01-12 DIAGNOSIS — Z880 Allergy status to penicillin: Secondary | ICD-10-CM | POA: Diagnosis not present

## 2020-01-12 DIAGNOSIS — D6869 Other thrombophilia: Secondary | ICD-10-CM | POA: Diagnosis not present

## 2020-01-12 DIAGNOSIS — I441 Atrioventricular block, second degree: Secondary | ICD-10-CM | POA: Insufficient documentation

## 2020-01-12 DIAGNOSIS — Z79899 Other long term (current) drug therapy: Secondary | ICD-10-CM | POA: Insufficient documentation

## 2020-01-12 DIAGNOSIS — Z87891 Personal history of nicotine dependence: Secondary | ICD-10-CM | POA: Insufficient documentation

## 2020-01-12 DIAGNOSIS — Z8249 Family history of ischemic heart disease and other diseases of the circulatory system: Secondary | ICD-10-CM | POA: Diagnosis not present

## 2020-01-12 HISTORY — DX: Other thrombophilia: D68.69

## 2020-01-12 HISTORY — DX: Paroxysmal atrial fibrillation: I48.0

## 2020-01-12 LAB — BASIC METABOLIC PANEL
Anion gap: 8 (ref 5–15)
BUN: 11 mg/dL (ref 8–23)
CO2: 26 mmol/L (ref 22–32)
Calcium: 9.2 mg/dL (ref 8.9–10.3)
Chloride: 104 mmol/L (ref 98–111)
Creatinine, Ser: 1.11 mg/dL (ref 0.61–1.24)
GFR calc Af Amer: >60 mL/min (ref >60)
GFR calc non Af Amer: >60 mL/min (ref >60)
Glucose, Bld: 104 mg/dL — ABNORMAL HIGH (ref 70–99)
Potassium: 5.6 mmol/L — ABNORMAL HIGH (ref 3.5–5.1)
Sodium: 138 mmol/L (ref 135–145)

## 2020-01-12 LAB — CBC
HCT: 42.1 % (ref 39.0–52.0)
Hemoglobin: 13.8 g/dL (ref 13.0–17.0)
MCH: 33.7 pg (ref 26.0–34.0)
MCHC: 32.8 g/dL (ref 30.0–36.0)
MCV: 102.7 fL — ABNORMAL HIGH (ref 80.0–100.0)
Platelets: 187 10*3/uL (ref 150–400)
RBC: 4.1 MIL/uL — ABNORMAL LOW (ref 4.22–5.81)
RDW: 13.4 % (ref 11.5–15.5)
WBC: 4.1 10*3/uL (ref 4.0–10.5)
nRBC: 0 % (ref 0.0–0.2)

## 2020-01-12 MED ORDER — APIXABAN 5 MG PO TABS: 5.0000 mg | ORAL_TABLET | Freq: Two times a day (BID) | ORAL | 3 refills | Status: DC

## 2020-01-12 NOTE — Progress Notes (Signed)
Primary Care Physician: Lowella Dandy, NP Primary Cardiologist: Dr Bettina Gavia Primary Electrophysiologist: Dr Curt Bears Referring Physician: Dr Joseph Berkshire Dillon Wallace is a 86 y.o. male with a history of second degree AV block type II s/p PPM, SVT, HTN who presents for follow up in the [facility]. The patient was diagnosed with atrial fibrillation on remote device interrogation 01/09/20 showing 0.3% burden. Preveiously, he had minimal episodes of SVT vs atrial flutter. Patient has a CHADS2VASC score of 3. He is unaware of his arrhythmia. He denies significant alcohol use but does admit to snoring and daytime somnolence.   Today, he denies symptoms of palpitations, chest pain, shortness of breath, orthopnea, PND, lower extremity edema, dizziness, presyncope, syncope, bleeding, or neurologic sequela. The patient is tolerating medications without difficulties and is otherwise without complaint today.    Atrial Fibrillation Risk Factors:  he does not have symptoms or diagnosis of sleep apnea. he does not have a history of rheumatic fever. he does not have a history of alcohol use. The patient does not have a history of early familial atrial fibrillation or other arrhythmias.  he has a BMI of Body mass index is 26.86 kg/m.Marland Kitchen Filed Weights   01/12/20 1016  Weight: 84.9 kg    Family History  Problem Relation Age of Onset  . Heart attack Father   . COPD Brother      Atrial Fibrillation Management history:  Previous antiarrhythmic drugs: none Previous cardioversions: none Previous ablations: none CHADS2VASC score: 3 Anticoagulation history: none   Past Medical History:  Diagnosis Date  . Bradycardia    Past Surgical History:  Procedure Laterality Date  . PACEMAKER IMPLANT N/A 07/09/2018   Procedure: PACEMAKER IMPLANT;  Surgeon: Evans Lance, MD;  Location: [facility];  Service: Cardiovascular;  Laterality: N/A;  . TONSILLECTOMY      Current  Outpatient Medications  Medication Sig Dispense Refill  . erythromycin ophthalmic ointment 2 (two) times daily.    Marland Kitchen loratadine (CLARITIN) 10 MG tablet Take 10 mg by mouth daily.    . montelukast (SINGULAIR) 10 MG tablet Take 10 mg by mouth at bedtime.    Marland Kitchen apixaban (ELIQUIS) 5 MG TABS tablet Take 1 tablet (5 mg total) by mouth 2 (two) times daily. 60 tablet 3   No current facility-administered medications for this encounter.    Allergies  Allergen Reactions  . Penicillins Other (See Comments)    Did it involve swelling of the face/tongue/throat, SOB, or low BP? No Did it involve sudden or severe rash/hives, skin peeling, or any reaction on the inside of your mouth or nose? Yes Did you need to seek medical attention at a hospital or doctor's office? Yes When did it last happen?years ago If all above answers are "NO", may proceed with cephalosporin use.    Social History   Socioeconomic History  . Marital status: Married    Spouse name: Not on file  . Number of children: Not on file  . Years of education: Not on file  . Highest education level: Not on file  Occupational History  . Not on file  Tobacco Use  . Smoking status: Former Smoker    Packs/day: 1.00    Years: 5.00    Pack years: 5.00    Quit date: 06/23/1962    Years since quitting: 57.5  . Smokeless tobacco: Never Used  Vaping Use  . Vaping Use: Never used  Substance and Sexual Activity  .  Alcohol use: Yes    Alcohol/week: 1.0 standard drink    Types: 1 Shots of liquor per week    Comment: 1 weekly  . Drug use: Never  . Sexual activity: Not on file  Other Topics Concern  . Not on file  Social History Narrative  . Not on file   Social Determinants of Health   Financial Resource Strain:   . Difficulty of Paying Living Expenses:   Food Insecurity:   . Worried About Charity fundraiser in the Last Year:   . Arboriculturist in the Last Year:   Transportation Needs:   . Film/video editor  (Medical):   Marland Kitchen Lack of Transportation (Non-Medical):   Physical Activity:   . Days of Exercise per Week:   . Minutes of Exercise per Session:   Stress:   . Feeling of Stress :   Social Connections:   . Frequency of Communication with Friends and Family:   . Frequency of Social Gatherings with Friends and Family:   . Attends Religious Services:   . Active Member of Clubs or Organizations:   . Attends Archivist Meetings:   Marland Kitchen Marital Status:   Intimate Partner Violence:   . Fear of Current or Ex-Partner:   . Emotionally Abused:   Marland Kitchen Physically Abused:   . Sexually Abused:      ROS- All systems are reviewed and negative except as per the HPI above.  Physical Exam: Vitals:   01/12/20 1016  BP: 150/86  Pulse: 85  Weight: 84.9 kg  Height: 5\' 10"  (1.778 m)    GEN- The patient is well appearing elderly male, alert and oriented x 3 today.   Head- normocephalic, atraumatic Eyes-  Sclera clear, conjunctiva pink Ears- hearing intact Oropharynx- clear Neck- supple  Lungs- Clear to ausculation bilaterally, normal work of breathing Heart- Regular rate and rhythm, no murmurs, rubs or gallops  GI- soft, NT, ND, + BS Extremities- no clubbing, cyanosis, or edema MS- no significant deformity or atrophy Skin- no rash or lesion Psych- euthymic mood, full affect Neuro- strength and sensation are intact  Wt Readings from Last 3 Encounters:  01/12/20 84.9 kg  09/22/19 86.2 kg  08/11/18 85.3 kg    EKG today demonstrates A sense V pace rhythm HR 85, PR 182, QRS 122, QTc 483  Echo 07/09/18 demonstrated  - Left ventricle: The cavity size was normal. Systolic function was  normal. The estimated ejection fraction was in the range of 60%  to 65%. Wall motion was normal; there were no regional wall  motion abnormalities.  - Mitral valve: Calcified annulus. Mildly thickened leaflets .  There was moderate regurgitation.  - Left atrium: The atrium was mildly dilated.  -  Right atrium: The atrium was mildly dilated.  Epic records are reviewed at length today  CHA2DS2-VASc Score = 3  The patient's score is based upon: CHF History: 0 HTN History: 1 Age : 2 Diabetes History: 0 Stroke History: 0 Vascular Disease History: 0 Gender: 0      ASSESSMENT AND PLAN: 1. Paroxysmal Atrial Fibrillation/SVT The patient's CHA2DS2-VASc score is 3, indicating a 3.2% annual risk of stroke.   Noted on device transmission, 0.3% burden. General education about afib provided and questions answered. We also discussed his stroke risk and the risks and benefits of anticoagulation.  Will plan to stop ASA and start Eliquis 5 mg BID. Check bmet/CBC today.  2. Secondary Hypercoagulable State (ICD10:  D68.69) The patient  is at significant risk for stroke/thromboembolism based upon his CHA2DS2-VASc Score of 3.  Start Apixaban (Eliquis).   3. 2nd Degree AV block type II S/p PPM, followed by Dr Curt Bears and the device clinic.  4. Snoring/daytime somnolence The importance of adequate treatment of sleep apnea was discussed today in order to improve our ability to maintain sinus rhythm long term. Patient would like for this to be arrange per his PCP.    Follow up in the AF clinic in one month.    [facility] [address] [place], [place] [address] [phone] 01/12/2020 11:57 AM

## 2020-01-12 NOTE — Patient Instructions (Signed)
Stop Aspirin Start Eliquis 5mg  - Take 1 tablet by mouth twice daily

## 2020-02-07 DIAGNOSIS — I480 Paroxysmal atrial fibrillation: Secondary | ICD-10-CM | POA: Diagnosis not present

## 2020-02-07 DIAGNOSIS — G4710 Hypersomnia, unspecified: Secondary | ICD-10-CM | POA: Diagnosis not present

## 2020-02-07 DIAGNOSIS — R5383 Other fatigue: Secondary | ICD-10-CM | POA: Diagnosis not present

## 2020-02-07 DIAGNOSIS — Z6827 Body mass index (BMI) 27.0-27.9, adult: Secondary | ICD-10-CM | POA: Diagnosis not present

## 2020-02-07 DIAGNOSIS — R5381 Other malaise: Secondary | ICD-10-CM | POA: Diagnosis not present

## 2020-02-07 DIAGNOSIS — R0683 Snoring: Secondary | ICD-10-CM | POA: Diagnosis not present

## 2020-02-09 ENCOUNTER — Ambulatory Visit (HOSPITAL_COMMUNITY): Payer: Medicare PPO | Admitting: Physician Assistant

## 2020-02-10 ENCOUNTER — Ambulatory Visit (HOSPITAL_COMMUNITY)
Admission: RE | Admit: 2020-02-10 | Discharge: 2020-02-10 | Disposition: A | Discharge status: Home / Self Care | Payer: Medicare PPO | Source: Ambulatory Visit | Attending: Physician Assistant | Admitting: Physician Assistant

## 2020-02-10 ENCOUNTER — Other Ambulatory Visit: Payer: Self-pay

## 2020-02-10 VITALS — BP 140/84 | HR 99 | Ht 70.0 in | Wt 190.4 lb

## 2020-02-10 DIAGNOSIS — Z79899 Other long term (current) drug therapy: Secondary | ICD-10-CM | POA: Diagnosis not present

## 2020-02-10 DIAGNOSIS — I10 Essential (primary) hypertension: Secondary | ICD-10-CM | POA: Insufficient documentation

## 2020-02-10 DIAGNOSIS — D6869 Other thrombophilia: Secondary | ICD-10-CM | POA: Diagnosis not present

## 2020-02-10 DIAGNOSIS — Z950 Presence of cardiac pacemaker: Secondary | ICD-10-CM | POA: Insufficient documentation

## 2020-02-10 DIAGNOSIS — R0683 Snoring: Secondary | ICD-10-CM | POA: Diagnosis not present

## 2020-02-10 DIAGNOSIS — Z87891 Personal history of nicotine dependence: Secondary | ICD-10-CM | POA: Diagnosis not present

## 2020-02-10 DIAGNOSIS — R400 Somnolence: Secondary | ICD-10-CM | POA: Insufficient documentation

## 2020-02-10 DIAGNOSIS — I441 Atrioventricular block, second degree: Secondary | ICD-10-CM | POA: Diagnosis not present

## 2020-02-10 DIAGNOSIS — Z8249 Family history of ischemic heart disease and other diseases of the circulatory system: Secondary | ICD-10-CM | POA: Insufficient documentation

## 2020-02-10 DIAGNOSIS — Z7901 Long term (current) use of anticoagulants: Secondary | ICD-10-CM | POA: Insufficient documentation

## 2020-02-10 DIAGNOSIS — I471 Supraventricular tachycardia: Secondary | ICD-10-CM | POA: Diagnosis not present

## 2020-02-10 DIAGNOSIS — I480 Paroxysmal atrial fibrillation: Secondary | ICD-10-CM | POA: Insufficient documentation

## 2020-02-10 DIAGNOSIS — Z880 Allergy status to penicillin: Secondary | ICD-10-CM | POA: Insufficient documentation

## 2020-02-10 LAB — CBC
HCT: 41.2 % (ref 39.0–52.0)
Hemoglobin: 13.3 g/dL (ref 13.0–17.0)
MCH: 33 pg (ref 26.0–34.0)
MCHC: 32.3 g/dL (ref 30.0–36.0)
MCV: 102.2 fL — ABNORMAL HIGH (ref 80.0–100.0)
Platelets: 193 10*3/uL (ref 150–400)
RBC: 4.03 MIL/uL — ABNORMAL LOW (ref 4.22–5.81)
RDW: 13.3 % (ref 11.5–15.5)
WBC: 5.1 10*3/uL (ref 4.0–10.5)
nRBC: 0 % (ref 0.0–0.2)

## 2020-02-10 LAB — BASIC METABOLIC PANEL
Anion gap: 9 (ref 5–15)
BUN: 11 mg/dL (ref 8–23)
CO2: 26 mmol/L (ref 22–32)
Calcium: 9.1 mg/dL (ref 8.9–10.3)
Chloride: 105 mmol/L (ref 98–111)
Creatinine, Ser: 1.11 mg/dL (ref 0.61–1.24)
GFR calc Af Amer: >60 mL/min (ref >60)
GFR calc non Af Amer: >60 mL/min (ref >60)
Glucose, Bld: 106 mg/dL — ABNORMAL HIGH (ref 70–99)
Potassium: 4.4 mmol/L (ref 3.5–5.1)
Sodium: 140 mmol/L (ref 135–145)

## 2020-02-10 NOTE — Progress Notes (Addendum)
Primary Care Physician: Lowella Dandy, NP Primary Cardiologist: Dr Bettina Gavia Primary Electrophysiologist: Dr Curt Bears Referring Physician: Dr Joseph Berkshire Dillon Wallace is a 86 y.o. male with a history of second degree AV block type II s/p PPM, SVT, HTN who presents for follow up in the [facility]. The patient was diagnosed with atrial fibrillation on remote device interrogation 01/09/20 showing 0.3% burden. Preveiously, he had minimal episodes of SVT vs atrial flutter. Patient has a CHADS2VASC score of 3. He is unaware of his arrhythmia. He denies significant alcohol use but does admit to snoring and daytime somnolence.   On follow up today, patient reports he has done very well since his last visit. He states that he has more energy lately. He denies any bleeding issues on anticoagulation.   Today, he denies symptoms of palpitations, chest pain, shortness of breath, orthopnea, PND, lower extremity edema, dizziness, presyncope, syncope, bleeding, or neurologic sequela. The patient is tolerating medications without difficulties and is otherwise without complaint today.    Atrial Fibrillation Risk Factors:  he does not have symptoms or diagnosis of sleep apnea. he does not have a history of rheumatic fever. he does not have a history of alcohol use. The patient does not have a history of early familial atrial fibrillation or other arrhythmias.  he has a BMI of Body mass index is 27.32 kg/m.Marland Kitchen Filed Weights   02/10/20 0954  Weight: 86.4 kg    Family History  Problem Relation Age of Onset  . Heart attack Father   . COPD Brother      Atrial Fibrillation Management history:  Previous antiarrhythmic drugs: none Previous cardioversions: none Previous ablations: none CHADS2VASC score: 3 Anticoagulation history: Eliquis    Past Medical History:  Diagnosis Date  . Bradycardia    Past Surgical History:  Procedure Laterality Date  . PACEMAKER IMPLANT N/A  07/09/2018   Procedure: PACEMAKER IMPLANT;  Surgeon: Evans Lance, MD;  Location: [facility];  Service: Cardiovascular;  Laterality: N/A;  . TONSILLECTOMY      Current Outpatient Medications  Medication Sig Dispense Refill  . apixaban (ELIQUIS) 5 MG TABS tablet Take 1 tablet (5 mg total) by mouth 2 (two) times daily. 60 tablet 3  . erythromycin ophthalmic ointment 2 (two) times daily.    Marland Kitchen loratadine (CLARITIN) 10 MG tablet Take 10 mg by mouth daily.    . montelukast (SINGULAIR) 10 MG tablet Take 10 mg by mouth daily.      No current facility-administered medications for this encounter.    Allergies  Allergen Reactions  . Penicillins Other (See Comments)    Did it involve swelling of the face/tongue/throat, SOB, or low BP? No Did it involve sudden or severe rash/hives, skin peeling, or any reaction on the inside of your mouth or nose? Yes Did you need to seek medical attention at a hospital or doctor's office? Yes When did it last happen?years ago If all above answers are "NO", may proceed with cephalosporin use.    Social History   Socioeconomic History  . Marital status: Married    Spouse name: Not on file  . Number of children: Not on file  . Years of education: Not on file  . Highest education level: Not on file  Occupational History  . Not on file  Tobacco Use  . Smoking status: Former Smoker    Packs/day: 1.00    Years: 5.00    Pack years: 5.00  Quit date: 06/23/1962    Years since quitting: 57.6  . Smokeless tobacco: Never Used  Vaping Use  . Vaping Use: Never used  Substance and Sexual Activity  . Alcohol use: Yes    Alcohol/week: 1.0 standard drink    Types: 1 Shots of liquor per week    Comment: 1 weekly  . Drug use: Never  . Sexual activity: Not on file  Other Topics Concern  . Not on file  Social History Narrative  . Not on file   Social Determinants of Health   Financial Resource Strain:   . Difficulty of Paying Living Expenses:  Not on file  Food Insecurity:   . Worried About Charity fundraiser in the Last Year: Not on file  . Ran Out of Food in the Last Year: Not on file  Transportation Needs:   . Lack of Transportation (Medical): Not on file  . Lack of Transportation (Non-Medical): Not on file  Physical Activity:   . Days of Exercise per Week: Not on file  . Minutes of Exercise per Session: Not on file  Stress:   . Feeling of Stress : Not on file  Social Connections:   . Frequency of Communication with Friends and Family: Not on file  . Frequency of Social Gatherings with Friends and Family: Not on file  . Attends Religious Services: Not on file  . Active Member of Clubs or Organizations: Not on file  . Attends Archivist Meetings: Not on file  . Marital Status: Not on file  Intimate Partner Violence:   . Fear of Current or Ex-Partner: Not on file  . Emotionally Abused: Not on file  . Physically Abused: Not on file  . Sexually Abused: Not on file     ROS- All systems are reviewed and negative except as per the HPI above.  Physical Exam: Vitals:   02/10/20 0954  BP: 140/84  Pulse: 99  Weight: 86.4 kg  Height: 5\' 10"  (1.778 m)    GEN- The patient is well appearing elderly male, alert and oriented x 3 today.   HEENT-head normocephalic, atraumatic, sclera clear, conjunctiva pink, hearing intact, trachea midline. Lungs- Clear to ausculation bilaterally, normal work of breathing Heart- Regular rate and rhythm, no murmurs, rubs or gallops  GI- soft, NT, ND, + BS Extremities- no clubbing, cyanosis, or edema MS- no significant deformity or atrophy Skin- no rash or lesion Psych- euthymic mood, full affect Neuro- strength and sensation are intact   Wt Readings from Last 3 Encounters:  02/10/20 86.4 kg  01/12/20 84.9 kg  09/22/19 86.2 kg    EKG today demonstrates A sense V paced rhythm HR 99, PR 188, QRS 122, QTc 497  Echo 07/09/18 demonstrated  - Left ventricle: The cavity size was  normal. Systolic function was  normal. The estimated ejection fraction was in the range of 60%  to 65%. Wall motion was normal; there were no regional wall  motion abnormalities.  - Mitral valve: Calcified annulus. Mildly thickened leaflets .  There was moderate regurgitation.  - Left atrium: The atrium was mildly dilated.  - Right atrium: The atrium was mildly dilated.  Epic records are reviewed at length today  CHA2DS2-VASc Score = 3  The patient's score is based upon: CHF History: 0 HTN History: 1 Age : 2 Diabetes History: 0 Stroke History: 0 Vascular Disease History: 0 Gender: 0      ASSESSMENT AND PLAN: 1. Paroxysmal Atrial Fibrillation/SVT The patient's CHA2DS2-VASc score  is 3, indicating a 3.2% annual risk of stroke.   Noted on device transmission, 0.3% burden. Continue Eliquis 5 mg BID. Check bmet/CBC today.  2. Secondary Hypercoagulable State (ICD10:  D68.69) The patient is at significant risk for stroke/thromboembolism based upon his CHA2DS2-VASc Score of 3.  Continue Apixaban (Eliquis).   3. 2nd Degree AV block type II S/p PPM, followed by Dr Curt Bears and the device clinic.  4. Snoring/daytime somnolence Home sleep study pending per PCP.    Follow up with Dr Curt Bears in 3 months. Dr Bettina Gavia per recall.    [facility] [address] [place], [place] [address] [phone] 02/10/2020 10:29 AM

## 2020-03-01 DIAGNOSIS — L905 Scar conditions and fibrosis of skin: Secondary | ICD-10-CM | POA: Diagnosis not present

## 2020-03-01 DIAGNOSIS — L819 Disorder of pigmentation, unspecified: Secondary | ICD-10-CM | POA: Diagnosis not present

## 2020-03-01 DIAGNOSIS — L720 Epidermal cyst: Secondary | ICD-10-CM | POA: Diagnosis not present

## 2020-03-01 DIAGNOSIS — Z85828 Personal history of other malignant neoplasm of skin: Secondary | ICD-10-CM | POA: Diagnosis not present

## 2020-03-01 DIAGNOSIS — L570 Actinic keratosis: Secondary | ICD-10-CM | POA: Diagnosis not present

## 2020-03-15 DIAGNOSIS — E785 Hyperlipidemia, unspecified: Secondary | ICD-10-CM | POA: Diagnosis not present

## 2020-03-15 DIAGNOSIS — Z0000 Encounter for general adult medical examination without abnormal findings: Secondary | ICD-10-CM | POA: Diagnosis not present

## 2020-03-15 DIAGNOSIS — Z9181 History of falling: Secondary | ICD-10-CM | POA: Diagnosis not present

## 2020-03-15 DIAGNOSIS — Z1331 Encounter for screening for depression: Secondary | ICD-10-CM | POA: Diagnosis not present

## 2020-03-26 DIAGNOSIS — E785 Hyperlipidemia, unspecified: Secondary | ICD-10-CM | POA: Diagnosis not present

## 2020-03-26 DIAGNOSIS — Z23 Encounter for immunization: Secondary | ICD-10-CM | POA: Diagnosis not present

## 2020-03-26 DIAGNOSIS — Z6827 Body mass index (BMI) 27.0-27.9, adult: Secondary | ICD-10-CM | POA: Diagnosis not present

## 2020-03-26 DIAGNOSIS — I480 Paroxysmal atrial fibrillation: Secondary | ICD-10-CM | POA: Diagnosis not present

## 2020-03-26 DIAGNOSIS — Z950 Presence of cardiac pacemaker: Secondary | ICD-10-CM | POA: Diagnosis not present

## 2020-03-26 DIAGNOSIS — J309 Allergic rhinitis, unspecified: Secondary | ICD-10-CM | POA: Diagnosis not present

## 2020-03-26 DIAGNOSIS — Z79899 Other long term (current) drug therapy: Secondary | ICD-10-CM | POA: Diagnosis not present

## 2020-03-26 DIAGNOSIS — R0683 Snoring: Secondary | ICD-10-CM | POA: Diagnosis not present

## 2020-04-09 ENCOUNTER — Ambulatory Visit (INDEPENDENT_AMBULATORY_CARE_PROVIDER_SITE_OTHER): Payer: Medicare PPO

## 2020-04-09 DIAGNOSIS — R001 Bradycardia, unspecified: Secondary | ICD-10-CM | POA: Diagnosis not present

## 2020-04-10 LAB — CUP PACEART REMOTE DEVICE CHECK
Battery Remaining Longevity: 120 mo
Battery Voltage: 3.01 V
Brady Statistic AP VP Percent: 2.28 %
Brady Statistic AP VS Percent: 0 %
Brady Statistic AS VP Percent: 94.22 %
Brady Statistic AS VS Percent: 3.5 %
Brady Statistic RA Percent Paced: 2.75 %
Brady Statistic RV Percent Paced: 96.5 %
Date Time Interrogation Session: 20211017222831
Implantable Lead Implant Date: 20200117
Implantable Lead Implant Date: 20200117
Implantable Lead Location: 753859
Implantable Lead Location: 753860
Implantable Lead Model: 3830
Implantable Lead Model: 5076
Implantable Pulse Generator Implant Date: 20200117
Lead Channel Impedance Value: 361 Ohm
Lead Channel Impedance Value: 418 Ohm
Lead Channel Impedance Value: 570 Ohm
Lead Channel Impedance Value: 589 Ohm
Lead Channel Pacing Threshold Amplitude: 0.625 V
Lead Channel Pacing Threshold Amplitude: 0.625 V
Lead Channel Pacing Threshold Pulse Width: 0.4 ms
Lead Channel Pacing Threshold Pulse Width: 0.4 ms
Lead Channel Sensing Intrinsic Amplitude: 17.875 mV
Lead Channel Sensing Intrinsic Amplitude: 17.875 mV
Lead Channel Sensing Intrinsic Amplitude: 8 mV
Lead Channel Sensing Intrinsic Amplitude: 8 mV
Lead Channel Setting Pacing Amplitude: 1.5 V
Lead Channel Setting Pacing Amplitude: 2.5 V
Lead Channel Setting Pacing Pulse Width: 0.4 ms
Lead Channel Setting Sensing Sensitivity: 2 mV

## 2020-04-12 DIAGNOSIS — M25521 Pain in right elbow: Secondary | ICD-10-CM | POA: Diagnosis not present

## 2020-04-13 NOTE — Progress Notes (Signed)
Remote pacemaker transmission.   

## 2020-05-03 ENCOUNTER — Other Ambulatory Visit (HOSPITAL_COMMUNITY): Payer: Self-pay | Admitting: *Deleted

## 2020-05-03 MED ORDER — APIXABAN 5 MG PO TABS: 5.0000 mg | ORAL_TABLET | Freq: Two times a day (BID) | ORAL | 6 refills | Status: DC

## 2020-05-14 ENCOUNTER — Other Ambulatory Visit: Payer: Self-pay

## 2020-05-14 ENCOUNTER — Ambulatory Visit (INDEPENDENT_AMBULATORY_CARE_PROVIDER_SITE_OTHER): Payer: Medicare PPO | Admitting: Cardiology

## 2020-05-14 ENCOUNTER — Encounter: Payer: Self-pay | Admitting: Cardiology

## 2020-05-14 VITALS — BP 124/74 | HR 106 | Ht 70.0 in | Wt 191.4 lb

## 2020-05-14 DIAGNOSIS — I441 Atrioventricular block, second degree: Secondary | ICD-10-CM | POA: Diagnosis not present

## 2020-05-14 LAB — CUP PACEART INCLINIC DEVICE CHECK
Battery Remaining Longevity: 119 mo
Battery Voltage: 3.01 V
Brady Statistic AP VP Percent: 2.13 %
Brady Statistic AP VS Percent: 0.1 %
Brady Statistic AS VP Percent: 89.44 %
Brady Statistic AS VS Percent: 8.33 %
Brady Statistic RA Percent Paced: 2.68 %
Brady Statistic RV Percent Paced: 91.57 %
Date Time Interrogation Session: 20211122105100
Implantable Lead Implant Date: 20200117
Implantable Lead Implant Date: 20200117
Implantable Lead Location: 753859
Implantable Lead Location: 753860
Implantable Lead Model: 3830
Implantable Lead Model: 5076
Implantable Pulse Generator Implant Date: 20200117
Lead Channel Impedance Value: 361 Ohm
Lead Channel Impedance Value: 437 Ohm
Lead Channel Impedance Value: 589 Ohm
Lead Channel Impedance Value: 608 Ohm
Lead Channel Pacing Threshold Amplitude: 0.75 V
Lead Channel Pacing Threshold Amplitude: 0.75 V
Lead Channel Pacing Threshold Pulse Width: 0.4 ms
Lead Channel Pacing Threshold Pulse Width: 0.4 ms
Lead Channel Sensing Intrinsic Amplitude: 9.625 mV
Lead Channel Setting Pacing Amplitude: 1.5 V
Lead Channel Setting Pacing Amplitude: 2.5 V
Lead Channel Setting Pacing Pulse Width: 0.4 ms
Lead Channel Setting Sensing Sensitivity: 2 mV

## 2020-05-14 NOTE — Patient Instructions (Signed)
Medication Instructions:  Your physician recommends that you continue on your current medications as directed. Please refer to the Current Medication list given to you today.  *If you need a refill on your cardiac medications before your next appointment, please call your pharmacy*   Lab Work: None ordered   Testing/Procedures: None ordered   Follow-Up: At [facility], you and your health needs are our priority.  As part of our continuing mission to provide you with exceptional heart care, we have created designated Provider Care Teams.  These Care Teams include your primary Cardiologist (physician) and Advanced Practice Providers (APPs -  Physician Assistants and Nurse Practitioners) who all work together to provide you with the care you need, when you need it.  We recommend signing up for the patient portal called "MyChart".  Sign up information is provided on this After Visit Summary.  MyChart is used to connect with patients for Virtual Visits (Telemedicine).  Patients are able to view lab/test results, encounter notes, upcoming appointments, etc.  Non-urgent messages can be sent to your provider as well.   To learn more about what you can do with MyChart, go to [email or web address].    Remote monitoring is used to monitor your Pacemaker or ICD from home. This monitoring reduces the number of office visits required to check your device to one time per year. It allows [place] to keep an eye on the functioning of your device to ensure it is working properly. You are scheduled for a device check from home on 07/09/2020. You may send your transmission at any time that day. If you have a wireless device, the transmission will be sent automatically. After your physician reviews your transmission, you will receive a postcard with your next transmission date.  Your next appointment:   1 year(s)  The format for your next appointment:   In Person  Provider:   Allegra Lai, MD   Thank you  for choosing [facility]!!   Trinidad Curet, RN [phone]

## 2020-05-14 NOTE — Progress Notes (Signed)
Electrophysiology Office Note   Date:  05/14/2020   ID:  Dillon Wallace, DOB Dec 22, 1933, MRN [id number]  PCP:  Lowella Dandy, NP  Cardiologist:  Bettina Gavia Primary Electrophysiologist:  Tyjae Issa Dillon Leeds, MD    Chief Complaint: Second-degree AV block   History of Present Illness: Dillon Wallace is a 86 y.o. male who is being seen today for the evaluation of second-degree AV block at the request of Moon, Amy A, NP. Presenting today for electrophysiology evaluation.  He has a history of second-degree AV block status post Medtronic dual-chamber pacemaker implanted 07/09/2018, SVT, and hypertension.  He was diagnosed with atrial fibrillation on remote interrogation 01/09/2020 with 0.3% burden.  He has a chads 2 vas score of 3.  Today, he denies symptoms of palpitations, chest pain, shortness of breath, orthopnea, PND, lower extremity edema, claudication, dizziness, presyncope, syncope, bleeding, or neurologic sequela. The patient is tolerating medications without difficulties.  Since last being seen he has done well.  He has no chest pain or shortness of breath and is able to do all his daily activities.  He continues to go to the gym up to 3 days a week.   Past Medical History:  Diagnosis Date  . Bradycardia    Past Surgical History:  Procedure Laterality Date  . PACEMAKER IMPLANT N/A 07/09/2018   Procedure: PACEMAKER IMPLANT;  Surgeon: Evans Lance, MD;  Location: [facility];  Service: Cardiovascular;  Laterality: N/A;  . TONSILLECTOMY       Current Outpatient Medications  Medication Sig Dispense Refill  . apixaban (ELIQUIS) 5 MG TABS tablet Take 1 tablet (5 mg total) by mouth 2 (two) times daily. 60 tablet 6  . loratadine (CLARITIN) 10 MG tablet Take 10 mg by mouth daily as needed.     . montelukast (SINGULAIR) 10 MG tablet Take 10 mg by mouth daily.      No current facility-administered medications for this visit.    Allergies:   Penicillins   Social History:  The patient   reports that he quit smoking about 57 years ago. He has a 5.00 pack-year smoking history. He has never used smokeless tobacco. He reports current alcohol use of about 1.0 standard drink of alcohol per week. He reports that he does not use drugs.   Family History:  The patient's family history includes COPD in his brother; Heart attack in his father.    ROS:  Please see the history of present illness.   Otherwise, review of systems is positive for none.   All other systems are reviewed and negative.    PHYSICAL EXAM: VS:  BP 124/74   Pulse (!) 106   Ht 5\' 10"  (1.778 m)   Wt 191 lb 6.4 oz (86.8 kg)   SpO2 96%   BMI 27.46 kg/m  , BMI Body mass index is 27.46 kg/m. GEN: Well nourished, well developed, in no acute distress  HEENT: normal  Neck: no JVD, carotid bruits, or masses Cardiac: RRR; no murmurs, rubs, or gallops,no edema  Respiratory:  clear to auscultation bilaterally, normal work of breathing GI: soft, nontender, nondistended, + BS MS: no deformity or atrophy  Skin: warm and dry, device pocket is well healed Neuro:  Strength and sensation are intact Psych: euthymic mood, full affect  EKG:  EKG is not ordered today. Personal review of the ekg ordered 02/10/20 shows AV paced  Thanks device interrogation is reviewed today in detail.  See PaceArt for details.   Recent Labs: 02/10/2020:  BUN 11; Creatinine, Ser 1.11; Hemoglobin 13.3; Platelets 193; Potassium 4.4; Sodium 140    Lipid Panel  No results found for: CHOL, TRIG, HDL, CHOLHDL, VLDL, LDLCALC, LDLDIRECT   Wt Readings from Last 3 Encounters:  05/14/20 191 lb 6.4 oz (86.8 kg)  02/10/20 190 lb 6.4 oz (86.4 kg)  01/12/20 187 lb 3.2 oz (84.9 kg)      Other studies Reviewed: Additional studies/ records that were reviewed today include: TTE 07/10/19  Review of the above records today demonstrates:  - Left ventricle: The cavity size was normal. Systolic function was  normal. The estimated ejection fraction was in  the range of 60%  to 65%. Wall motion was normal; there were no regional wall  motion abnormalities.  - Mitral valve: Calcified annulus. Mildly thickened leaflets .  There was moderate regurgitation.  - Left atrium: The atrium was mildly dilated.  - Right atrium: The atrium was mildly dilated.    ASSESSMENT AND PLAN:  1.  Paroxysmal atrial fibrillation/SVT: CHA2DS2-VASc of 3.  Currently on Eliquis.  Minimal atrial fibrillation noted on device interrogation.  No changes.  2.  Mobitz 2 AV block: Status post Medtronic dual-chamber pacemaker implanted in 2020.  Device functioning appropriately.  No changes at this time.    Current medicines are reviewed at length with the patient today.   The patient does not have concerns regarding his medicines.  The following changes were made today:  none  Labs/ tests ordered today include:  No orders of the defined types were placed in this encounter.    Disposition:   FU with Macky Galik 1 year  Signed, Deundra Bard Dillon Leeds, MD  05/14/2020 11:05 AM     [address] [place] [address] [phone] (office) [phone] (fax)

## 2020-07-09 ENCOUNTER — Ambulatory Visit (INDEPENDENT_AMBULATORY_CARE_PROVIDER_SITE_OTHER): Payer: Medicare PPO

## 2020-07-09 DIAGNOSIS — I441 Atrioventricular block, second degree: Secondary | ICD-10-CM | POA: Diagnosis not present

## 2020-07-12 LAB — CUP PACEART REMOTE DEVICE CHECK
Battery Remaining Longevity: 116 mo
Battery Voltage: 3.01 V
Brady Statistic AP VP Percent: 2.08 %
Brady Statistic AP VS Percent: 0 %
Brady Statistic AS VP Percent: 95 %
Brady Statistic AS VS Percent: 2.92 %
Brady Statistic RA Percent Paced: 2.52 %
Brady Statistic RV Percent Paced: 97.07 %
Date Time Interrogation Session: 20220116231229
Implantable Lead Implant Date: 20200117
Implantable Lead Implant Date: 20200117
Implantable Lead Location: 753859
Implantable Lead Location: 753860
Implantable Lead Model: 3830
Implantable Lead Model: 5076
Implantable Pulse Generator Implant Date: 20200117
Lead Channel Impedance Value: 361 Ohm
Lead Channel Impedance Value: 418 Ohm
Lead Channel Impedance Value: 570 Ohm
Lead Channel Impedance Value: 646 Ohm
Lead Channel Pacing Threshold Amplitude: 0.625 V
Lead Channel Pacing Threshold Amplitude: 0.625 V
Lead Channel Pacing Threshold Pulse Width: 0.4 ms
Lead Channel Pacing Threshold Pulse Width: 0.4 ms
Lead Channel Sensing Intrinsic Amplitude: 17.875 mV
Lead Channel Sensing Intrinsic Amplitude: 17.875 mV
Lead Channel Sensing Intrinsic Amplitude: 8.25 mV
Lead Channel Sensing Intrinsic Amplitude: 8.25 mV
Lead Channel Setting Pacing Amplitude: 1.5 V
Lead Channel Setting Pacing Amplitude: 2.5 V
Lead Channel Setting Pacing Pulse Width: 0.4 ms
Lead Channel Setting Sensing Sensitivity: 2 mV

## 2020-07-23 NOTE — Progress Notes (Signed)
Remote pacemaker transmission.   

## 2020-07-30 DIAGNOSIS — I480 Paroxysmal atrial fibrillation: Secondary | ICD-10-CM | POA: Diagnosis not present

## 2020-07-30 DIAGNOSIS — Z6827 Body mass index (BMI) 27.0-27.9, adult: Secondary | ICD-10-CM | POA: Diagnosis not present

## 2020-07-30 DIAGNOSIS — E785 Hyperlipidemia, unspecified: Secondary | ICD-10-CM | POA: Diagnosis not present

## 2020-07-30 DIAGNOSIS — Z79899 Other long term (current) drug therapy: Secondary | ICD-10-CM | POA: Diagnosis not present

## 2020-07-30 DIAGNOSIS — J309 Allergic rhinitis, unspecified: Secondary | ICD-10-CM | POA: Diagnosis not present

## 2020-07-30 DIAGNOSIS — Z950 Presence of cardiac pacemaker: Secondary | ICD-10-CM | POA: Diagnosis not present

## 2020-07-30 DIAGNOSIS — R0683 Snoring: Secondary | ICD-10-CM | POA: Diagnosis not present

## 2020-09-01 IMAGING — CR DG CHEST 2V
2 series · 2 of 2 positions shown · non-contrast
Comparison: None.

CLINICAL DATA: Pacemaker

EXAM:
CHEST - 2 VIEW

[chest pa]
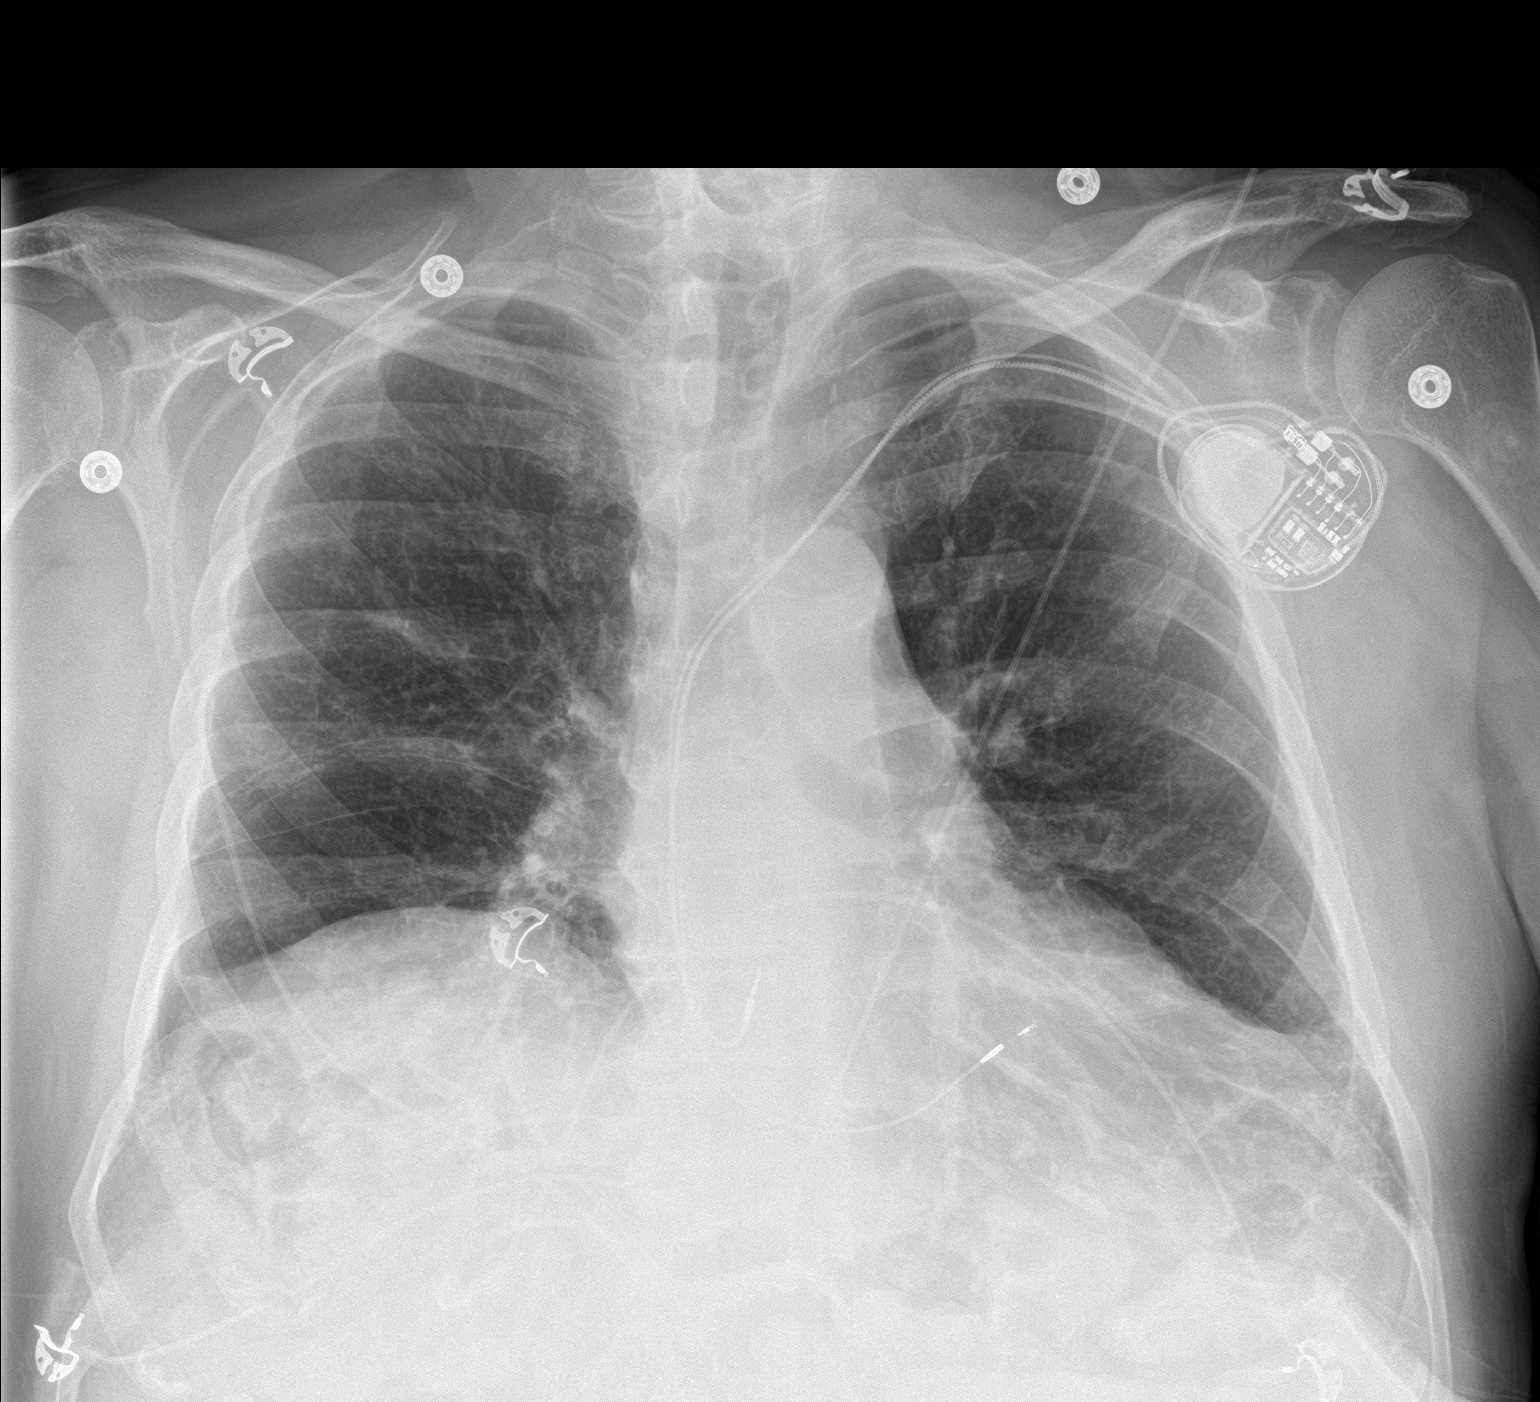

[chest lat]
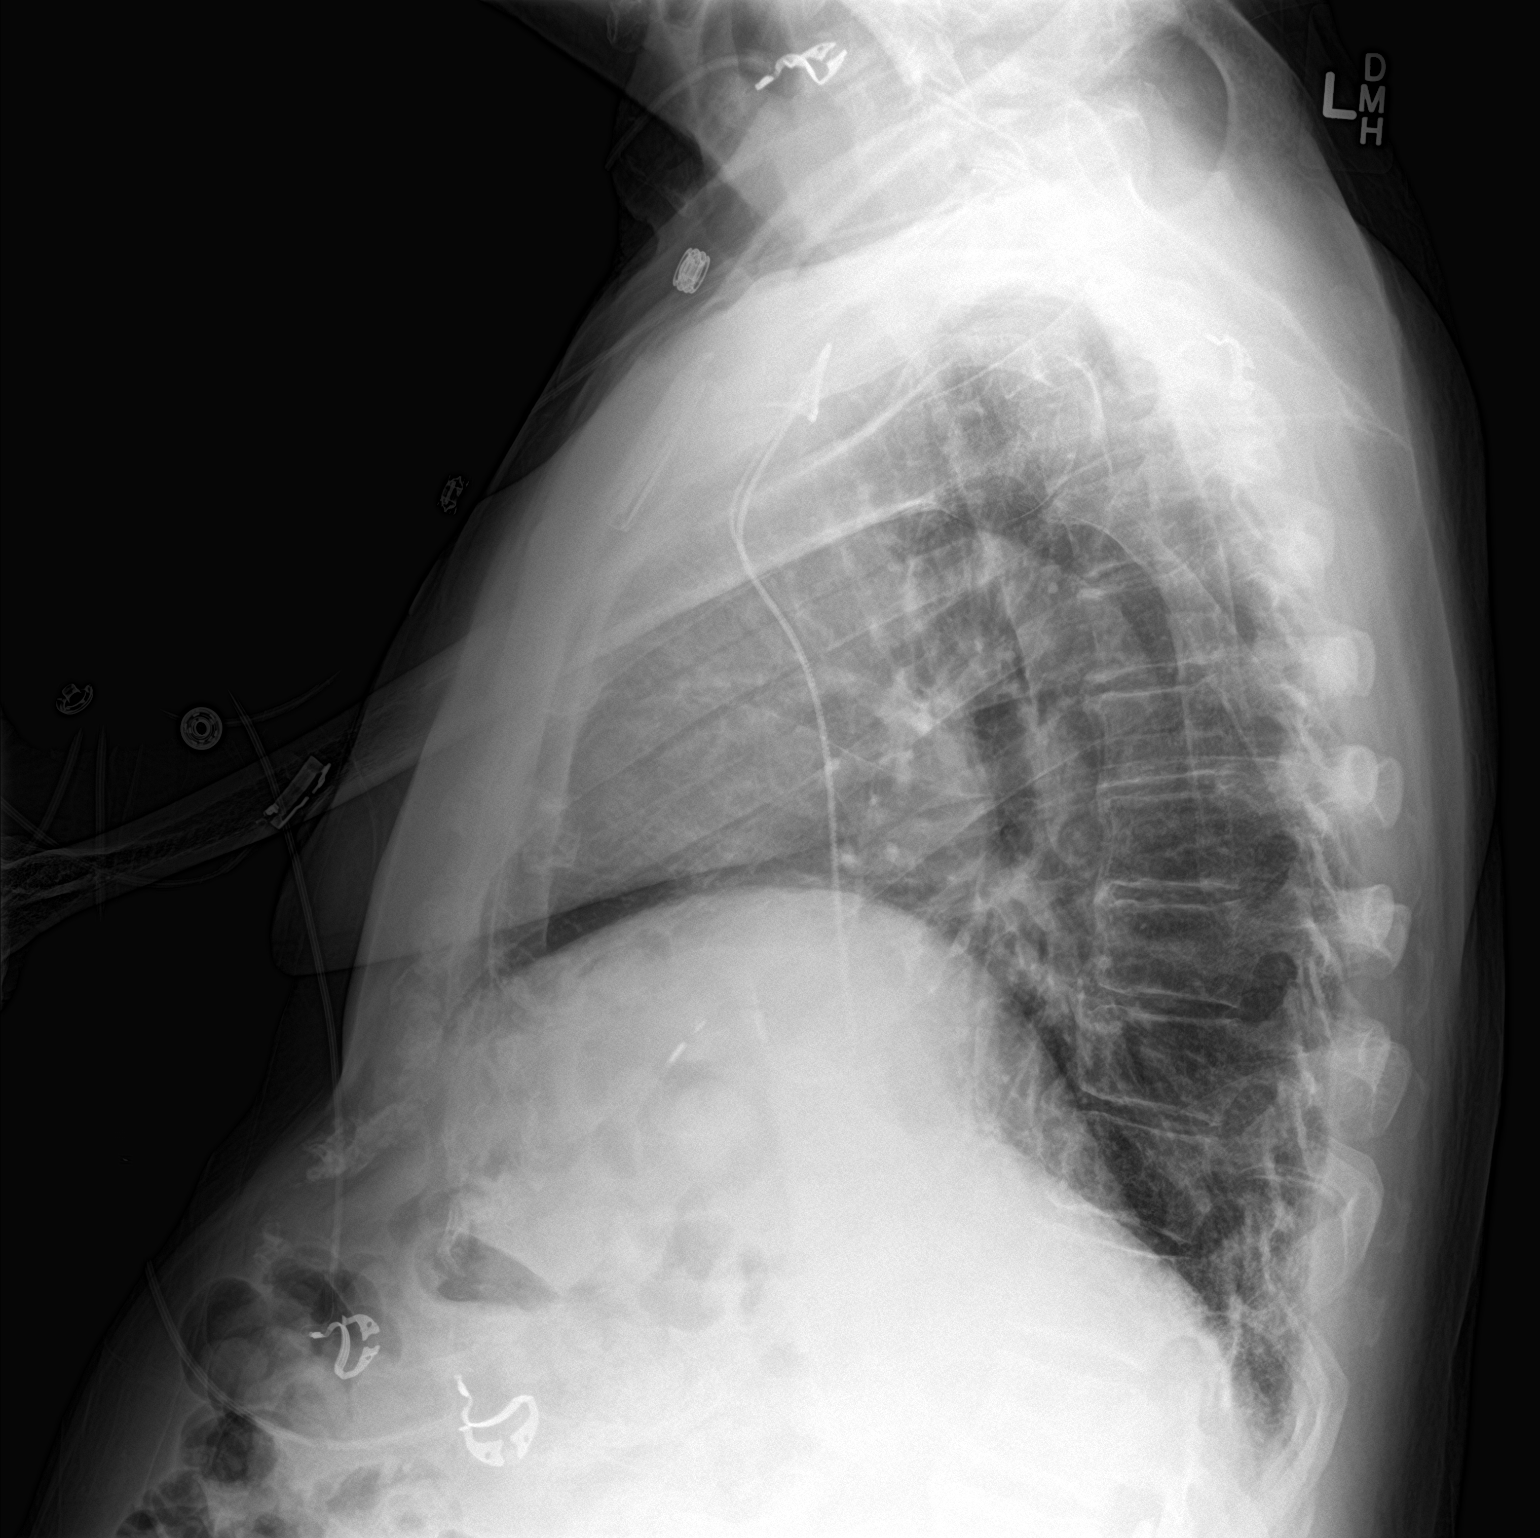

[2 of 2 positions shown; findings below may reference images not displayed]

FINDINGS: Two lead left subclavian pacemaker is noted with lead tips overlying
the right atrium and right ventricular apex. Normal heart size.
Normal mediastinal contour. No pneumothorax. No pleural effusion. No
pulmonary edema. Minimal reticulation at the lung bases. No
consolidative airspace disease.
IMPRESSION: 1. No pneumothorax. Two lead left subclavian pacemaker appears well
positioned.
2. Nonspecific minimal reticulation at the lung bases.

## 2020-09-13 DIAGNOSIS — D1801 Hemangioma of skin and subcutaneous tissue: Secondary | ICD-10-CM | POA: Diagnosis not present

## 2020-09-13 DIAGNOSIS — C44319 Basal cell carcinoma of skin of other parts of face: Secondary | ICD-10-CM | POA: Diagnosis not present

## 2020-09-13 DIAGNOSIS — L821 Other seborrheic keratosis: Secondary | ICD-10-CM | POA: Diagnosis not present

## 2020-09-13 DIAGNOSIS — D485 Neoplasm of uncertain behavior of skin: Secondary | ICD-10-CM | POA: Diagnosis not present

## 2020-09-13 DIAGNOSIS — Z85828 Personal history of other malignant neoplasm of skin: Secondary | ICD-10-CM | POA: Diagnosis not present

## 2020-09-13 DIAGNOSIS — L570 Actinic keratosis: Secondary | ICD-10-CM | POA: Diagnosis not present

## 2020-09-13 DIAGNOSIS — L819 Disorder of pigmentation, unspecified: Secondary | ICD-10-CM | POA: Diagnosis not present

## 2020-09-13 DIAGNOSIS — L905 Scar conditions and fibrosis of skin: Secondary | ICD-10-CM | POA: Diagnosis not present

## 2020-09-13 DIAGNOSIS — L814 Other melanin hyperpigmentation: Secondary | ICD-10-CM | POA: Diagnosis not present

## 2020-10-08 ENCOUNTER — Ambulatory Visit (INDEPENDENT_AMBULATORY_CARE_PROVIDER_SITE_OTHER): Payer: Medicare PPO

## 2020-10-08 DIAGNOSIS — I441 Atrioventricular block, second degree: Secondary | ICD-10-CM | POA: Diagnosis not present

## 2020-10-10 LAB — CUP PACEART REMOTE DEVICE CHECK
Battery Remaining Longevity: 113 mo
Battery Voltage: 3 V
Brady Statistic AP VP Percent: 2.8 %
Brady Statistic AP VS Percent: 0 %
Brady Statistic AS VP Percent: 94.5 %
Brady Statistic AS VS Percent: 2.71 %
Brady Statistic RA Percent Paced: 3.3 %
Brady Statistic RV Percent Paced: 97.29 %
Date Time Interrogation Session: 20220418034754
Implantable Lead Implant Date: 20200117
Implantable Lead Implant Date: 20200117
Implantable Lead Location: 753859
Implantable Lead Location: 753860
Implantable Lead Model: 3830
Implantable Lead Model: 5076
Implantable Pulse Generator Implant Date: 20200117
Lead Channel Impedance Value: 342 Ohm
Lead Channel Impedance Value: 399 Ohm
Lead Channel Impedance Value: 570 Ohm
Lead Channel Impedance Value: 608 Ohm
Lead Channel Pacing Threshold Amplitude: 0.625 V
Lead Channel Pacing Threshold Amplitude: 0.625 V
Lead Channel Pacing Threshold Pulse Width: 0.4 ms
Lead Channel Pacing Threshold Pulse Width: 0.4 ms
Lead Channel Sensing Intrinsic Amplitude: 17.875 mV
Lead Channel Sensing Intrinsic Amplitude: 17.875 mV
Lead Channel Sensing Intrinsic Amplitude: 8.75 mV
Lead Channel Sensing Intrinsic Amplitude: 8.75 mV
Lead Channel Setting Pacing Amplitude: 1.5 V
Lead Channel Setting Pacing Amplitude: 2.5 V
Lead Channel Setting Pacing Pulse Width: 0.4 ms
Lead Channel Setting Sensing Sensitivity: 2 mV

## 2020-10-25 NOTE — Progress Notes (Signed)
Remote pacemaker transmission.   

## 2020-10-30 DIAGNOSIS — R001 Bradycardia, unspecified: Secondary | ICD-10-CM | POA: Insufficient documentation

## 2020-11-09 DIAGNOSIS — H6123 Impacted cerumen, bilateral: Secondary | ICD-10-CM | POA: Diagnosis not present

## 2020-11-09 DIAGNOSIS — J3089 Other allergic rhinitis: Secondary | ICD-10-CM | POA: Diagnosis not present

## 2020-11-09 DIAGNOSIS — J300 Vasomotor rhinitis: Secondary | ICD-10-CM | POA: Diagnosis not present

## 2020-11-09 DIAGNOSIS — J309 Allergic rhinitis, unspecified: Secondary | ICD-10-CM | POA: Diagnosis not present

## 2020-11-09 DIAGNOSIS — H61303 Acquired stenosis of external ear canal, unspecified, bilateral: Secondary | ICD-10-CM | POA: Diagnosis not present

## 2020-11-20 NOTE — Progress Notes (Signed)
Cardiology Office Note:    Date:  11/21/2020   ID:  Dillon Wallace, DOB 1934/04/14, MRN [id number]  PCP:  Lowella Dandy, NP  Cardiologist:  Shirlee More, MD    Referring MD: Lowella Dandy, NP    ASSESSMENT:    1. AV block, Mobitz 2   2. Pacemaker   3. PSVT (paroxysmal supraventricular tachycardia) ([place])   4. PAF (paroxysmal atrial fibrillation) ([place])   5. Pure hypercholesterolemia    PLAN:    In order of problems listed above:  1. Overall he continues to do well permanent pacemaker normal parameters optimally programmed and not having any exercise intolerance.  He will continue to follow in device clinic 2. Stable SVT and paroxysmal atrial fibrillation 3. Hyperlipidemia now on a statin.   Next appointment: 1 year   Medication Adjustments/Labs and Tests Ordered: Current medicines are reviewed at length with the patient today.  Concerns regarding medicines are outlined above.  Orders Placed This Encounter  Procedures  . EKG 12-Lead   No orders of the defined types were placed in this encounter.   Chief Complaint  Patient presents with  . Follow-up    pacemaker    History of Present Illness:    Dillon Wallace is a 87 y.o. male with a hx of bradycardia and second-degree AV block with permanent pacemaker, SVT detected on device interrogation last seen May 09/22/2019.  Compliance with diet, lifestyle and medications: Yes  Is pleased with the quality of his life Works out on a regular basis and has no exercise dysfunction shortness of breath chest pain or syncope.  He has occasional palpitation at nighttime not severe or sustained. In February his LDL was severely elevated at 151 cholesterol 212 triglycerides 73 HDL 48 and was placed on Lipitor follow-up labs pending with his PCP. He is having no muscle pain or weakness. Is compliant with his anticoagulant no bleeding complication  His last device check 10/08/2020 showed normal parameters device function battery projection life  113 months there were brief episodes of atrial fibrillation total less than 10 minutes.  He is anticoagulated.    Past Medical History:  Diagnosis Date  . Bradycardia     Past Surgical History:  Procedure Laterality Date  . PACEMAKER IMPLANT N/A 07/09/2018   Procedure: PACEMAKER IMPLANT;  Surgeon: Evans Lance, MD;  Location: [facility];  Service: Cardiovascular;  Laterality: N/A;  . TONSILLECTOMY      Current Medications: Current Meds  Medication Sig  . apixaban (ELIQUIS) 5 MG TABS tablet Take 1 tablet (5 mg total) by mouth 2 (two) times daily.  Marland Kitchen atorvastatin (LIPITOR) 20 MG tablet Take 1 tablet by mouth daily.  . clindamycin (CLEOCIN) 300 MG capsule Take 300 mg by mouth every 6 (six) hours.  Marland Kitchen loratadine (CLARITIN) 10 MG tablet Take 10 mg by mouth daily as needed for allergies.  . montelukast (SINGULAIR) 10 MG tablet Take 10 mg by mouth daily.      Allergies:   Penicillins   Social History   Socioeconomic History  . Marital status: Married    Spouse name: Not on file  . Number of children: Not on file  . Years of education: Not on file  . Highest education level: Not on file  Occupational History  . Not on file  Tobacco Use  . Smoking status: Former Smoker    Packs/day: 1.00    Years: 5.00    Pack years: 5.00    Quit date: 06/23/1962  Years since quitting: 58.4  . Smokeless tobacco: Never Used  Vaping Use  . Vaping Use: Never used  Substance and Sexual Activity  . Alcohol use: Yes    Alcohol/week: 1.0 standard drink    Types: 1 Shots of liquor per week    Comment: 1 weekly  . Drug use: Never  . Sexual activity: Not on file  Other Topics Concern  . Not on file  Social History Narrative  . Not on file   Social Determinants of Health   Financial Resource Strain: Not on file  Food Insecurity: Not on file  Transportation Needs: Not on file  Physical Activity: Not on file  Stress: Not on file  Social Connections: Not on file     Family  History: The patient's family history includes COPD in his brother; Heart attack in his father. ROS:   Please see the history of present illness.    All other systems reviewed and are negative.  EKGs/Labs/Other Studies Reviewed:    The following studies were reviewed today:  EKG:  EKG ordered today and personally reviewed.  The ekg ordered today demonstrates atrial sensed ventricular paced rhythm relatively narrow QRS  Recent Labs: 02/10/2020: BUN 11; Creatinine, Ser 1.11; Hemoglobin 13.3; Platelets 193; Potassium 4.4; Sodium 140    Physical Exam:    VS:  BP 134/84 (BP Location: Left Arm, Patient Position: Sitting, Cuff Size: Normal)   Pulse 94   Ht 5\' 10"  (1.778 m)   Wt 191 lb 6.4 oz (86.8 kg)   SpO2 96%   BMI 27.46 kg/m     Wt Readings from Last 3 Encounters:  11/21/20 191 lb 6.4 oz (86.8 kg)  05/14/20 191 lb 6.4 oz (86.8 kg)  02/10/20 190 lb 6.4 oz (86.4 kg)     GEN:  Well nourished, well developed in no acute distress HEENT: Normal NECK: No JVD; No carotid bruits LYMPHATICS: No lymphadenopathy CARDIAC: RRR, no murmurs, rubs, gallops RESPIRATORY:  Clear to auscultation without rales, wheezing or rhonchi  ABDOMEN: Soft, non-tender, non-distended MUSCULOSKELETAL:  No edema; No deformity  SKIN: Warm and dry NEUROLOGIC:  Alert and oriented x 3 PSYCHIATRIC:  Normal affect    Signed, Shirlee More, MD  11/21/2020 8:53 AM    [facility]

## 2020-11-21 ENCOUNTER — Other Ambulatory Visit: Payer: Self-pay

## 2020-11-21 ENCOUNTER — Encounter: Payer: Self-pay | Admitting: Cardiology

## 2020-11-21 ENCOUNTER — Ambulatory Visit: Payer: Medicare PPO | Admitting: Cardiology

## 2020-11-21 VITALS — BP 134/84 | HR 94 | Ht 70.0 in | Wt 191.4 lb

## 2020-11-21 DIAGNOSIS — I441 Atrioventricular block, second degree: Secondary | ICD-10-CM

## 2020-11-21 DIAGNOSIS — Z950 Presence of cardiac pacemaker: Secondary | ICD-10-CM

## 2020-11-21 DIAGNOSIS — I471 Supraventricular tachycardia: Secondary | ICD-10-CM

## 2020-11-21 DIAGNOSIS — I480 Paroxysmal atrial fibrillation: Secondary | ICD-10-CM | POA: Diagnosis not present

## 2020-11-21 DIAGNOSIS — E7800 Pure hypercholesterolemia, unspecified: Secondary | ICD-10-CM | POA: Diagnosis not present

## 2020-11-21 NOTE — Patient Instructions (Signed)

## 2020-11-27 DIAGNOSIS — Z950 Presence of cardiac pacemaker: Secondary | ICD-10-CM | POA: Diagnosis not present

## 2020-11-27 DIAGNOSIS — E785 Hyperlipidemia, unspecified: Secondary | ICD-10-CM | POA: Diagnosis not present

## 2020-11-27 DIAGNOSIS — J309 Allergic rhinitis, unspecified: Secondary | ICD-10-CM | POA: Diagnosis not present

## 2020-11-27 DIAGNOSIS — I480 Paroxysmal atrial fibrillation: Secondary | ICD-10-CM | POA: Diagnosis not present

## 2020-11-27 DIAGNOSIS — Z6827 Body mass index (BMI) 27.0-27.9, adult: Secondary | ICD-10-CM | POA: Diagnosis not present

## 2020-11-27 DIAGNOSIS — Z139 Encounter for screening, unspecified: Secondary | ICD-10-CM | POA: Diagnosis not present

## 2020-11-27 DIAGNOSIS — R0683 Snoring: Secondary | ICD-10-CM | POA: Diagnosis not present

## 2020-11-27 DIAGNOSIS — Z79899 Other long term (current) drug therapy: Secondary | ICD-10-CM | POA: Diagnosis not present

## 2020-12-11 DIAGNOSIS — C44319 Basal cell carcinoma of skin of other parts of face: Secondary | ICD-10-CM | POA: Diagnosis not present

## 2020-12-11 DIAGNOSIS — C44329 Squamous cell carcinoma of skin of other parts of face: Secondary | ICD-10-CM | POA: Diagnosis not present

## 2020-12-19 DIAGNOSIS — H35349 Macular cyst, hole, or pseudohole, unspecified eye: Secondary | ICD-10-CM | POA: Diagnosis not present

## 2021-01-03 DIAGNOSIS — B356 Tinea cruris: Secondary | ICD-10-CM | POA: Diagnosis not present

## 2021-01-03 DIAGNOSIS — R21 Rash and other nonspecific skin eruption: Secondary | ICD-10-CM | POA: Diagnosis not present

## 2021-01-07 ENCOUNTER — Ambulatory Visit (INDEPENDENT_AMBULATORY_CARE_PROVIDER_SITE_OTHER): Payer: Medicare PPO

## 2021-01-07 DIAGNOSIS — I441 Atrioventricular block, second degree: Secondary | ICD-10-CM

## 2021-01-08 LAB — CUP PACEART REMOTE DEVICE CHECK
Battery Remaining Longevity: 111 mo
Battery Voltage: 3 V
Brady Statistic AP VP Percent: 2.66 %
Brady Statistic AP VS Percent: 0 %
Brady Statistic AS VP Percent: 94.8 %
Brady Statistic AS VS Percent: 2.54 %
Brady Statistic RA Percent Paced: 3.12 %
Brady Statistic RV Percent Paced: 97.46 %
Date Time Interrogation Session: 20220718044934
Implantable Lead Implant Date: 20200117
Implantable Lead Implant Date: 20200117
Implantable Lead Location: 753859
Implantable Lead Location: 753860
Implantable Lead Model: 3830
Implantable Lead Model: 5076
Implantable Pulse Generator Implant Date: 20200117
Lead Channel Impedance Value: 361 Ohm
Lead Channel Impedance Value: 437 Ohm
Lead Channel Impedance Value: 589 Ohm
Lead Channel Impedance Value: 589 Ohm
Lead Channel Pacing Threshold Amplitude: 0.625 V
Lead Channel Pacing Threshold Amplitude: 0.625 V
Lead Channel Pacing Threshold Pulse Width: 0.4 ms
Lead Channel Pacing Threshold Pulse Width: 0.4 ms
Lead Channel Sensing Intrinsic Amplitude: 17.875 mV
Lead Channel Sensing Intrinsic Amplitude: 17.875 mV
Lead Channel Sensing Intrinsic Amplitude: 7.875 mV
Lead Channel Sensing Intrinsic Amplitude: 7.875 mV
Lead Channel Setting Pacing Amplitude: 1.5 V
Lead Channel Setting Pacing Amplitude: 2.5 V
Lead Channel Setting Pacing Pulse Width: 0.4 ms
Lead Channel Setting Sensing Sensitivity: 2 mV

## 2021-01-30 NOTE — Progress Notes (Signed)
Remote pacemaker transmission.   

## 2021-03-26 ENCOUNTER — Other Ambulatory Visit: Payer: Self-pay | Admitting: *Deleted

## 2021-03-26 DIAGNOSIS — Z9181 History of falling: Secondary | ICD-10-CM | POA: Diagnosis not present

## 2021-03-26 DIAGNOSIS — Z1331 Encounter for screening for depression: Secondary | ICD-10-CM | POA: Diagnosis not present

## 2021-03-26 DIAGNOSIS — E785 Hyperlipidemia, unspecified: Secondary | ICD-10-CM | POA: Diagnosis not present

## 2021-03-26 DIAGNOSIS — Z0000 Encounter for general adult medical examination without abnormal findings: Secondary | ICD-10-CM | POA: Diagnosis not present

## 2021-03-26 MED ORDER — APIXABAN 5 MG PO TABS: 5.0000 mg | ORAL_TABLET | Freq: Two times a day (BID) | ORAL | 6 refills | Status: DC

## 2021-03-26 NOTE — Telephone Encounter (Signed)
Eliquis 5mg  paper refill request received. Patient is 87 years old, weight-86.8kg, Crea-1.19 on 11/27/2020 via [facility] at [place], [place], and last seen by Dr. Bettina Gavia on 11/27/2020. Dose is appropriate based on dosing criteria. Will send in refill to requested pharmacy.

## 2021-04-08 ENCOUNTER — Ambulatory Visit (INDEPENDENT_AMBULATORY_CARE_PROVIDER_SITE_OTHER): Payer: Medicare PPO

## 2021-04-08 DIAGNOSIS — I441 Atrioventricular block, second degree: Secondary | ICD-10-CM

## 2021-04-08 DIAGNOSIS — Z23 Encounter for immunization: Secondary | ICD-10-CM | POA: Diagnosis not present

## 2021-04-08 DIAGNOSIS — Z79899 Other long term (current) drug therapy: Secondary | ICD-10-CM | POA: Diagnosis not present

## 2021-04-08 DIAGNOSIS — J309 Allergic rhinitis, unspecified: Secondary | ICD-10-CM | POA: Diagnosis not present

## 2021-04-08 DIAGNOSIS — Z950 Presence of cardiac pacemaker: Secondary | ICD-10-CM | POA: Diagnosis not present

## 2021-04-08 DIAGNOSIS — E785 Hyperlipidemia, unspecified: Secondary | ICD-10-CM | POA: Diagnosis not present

## 2021-04-08 DIAGNOSIS — I480 Paroxysmal atrial fibrillation: Secondary | ICD-10-CM | POA: Diagnosis not present

## 2021-04-08 DIAGNOSIS — Z6827 Body mass index (BMI) 27.0-27.9, adult: Secondary | ICD-10-CM | POA: Diagnosis not present

## 2021-04-08 DIAGNOSIS — R21 Rash and other nonspecific skin eruption: Secondary | ICD-10-CM | POA: Diagnosis not present

## 2021-04-10 LAB — CUP PACEART REMOTE DEVICE CHECK
Battery Remaining Longevity: 106 mo
Battery Voltage: 3 V
Brady Statistic AP VP Percent: 3.96 %
Brady Statistic AP VS Percent: 0 %
Brady Statistic AS VP Percent: 93.92 %
Brady Statistic AS VS Percent: 2.12 %
Brady Statistic RA Percent Paced: 4.26 %
Brady Statistic RV Percent Paced: 97.88 %
Date Time Interrogation Session: 20221017175132
Implantable Lead Implant Date: 20200117
Implantable Lead Implant Date: 20200117
Implantable Lead Location: 753859
Implantable Lead Location: 753860
Implantable Lead Model: 3830
Implantable Lead Model: 5076
Implantable Pulse Generator Implant Date: 20200117
Lead Channel Impedance Value: 342 Ohm
Lead Channel Impedance Value: 399 Ohm
Lead Channel Impedance Value: 551 Ohm
Lead Channel Impedance Value: 551 Ohm
Lead Channel Pacing Threshold Amplitude: 0.625 V
Lead Channel Pacing Threshold Amplitude: 0.75 V
Lead Channel Pacing Threshold Pulse Width: 0.4 ms
Lead Channel Pacing Threshold Pulse Width: 0.4 ms
Lead Channel Sensing Intrinsic Amplitude: 17.875 mV
Lead Channel Sensing Intrinsic Amplitude: 17.875 mV
Lead Channel Sensing Intrinsic Amplitude: 7.625 mV
Lead Channel Sensing Intrinsic Amplitude: 7.625 mV
Lead Channel Setting Pacing Amplitude: 1.5 V
Lead Channel Setting Pacing Amplitude: 2.5 V
Lead Channel Setting Pacing Pulse Width: 0.4 ms
Lead Channel Setting Sensing Sensitivity: 2 mV

## 2021-04-17 NOTE — Progress Notes (Signed)
Remote pacemaker transmission.   

## 2021-05-07 DIAGNOSIS — L821 Other seborrheic keratosis: Secondary | ICD-10-CM | POA: Diagnosis not present

## 2021-05-07 DIAGNOSIS — L720 Epidermal cyst: Secondary | ICD-10-CM | POA: Diagnosis not present

## 2021-05-07 DIAGNOSIS — D1801 Hemangioma of skin and subcutaneous tissue: Secondary | ICD-10-CM | POA: Diagnosis not present

## 2021-05-07 DIAGNOSIS — L570 Actinic keratosis: Secondary | ICD-10-CM | POA: Diagnosis not present

## 2021-05-20 ENCOUNTER — Ambulatory Visit (INDEPENDENT_AMBULATORY_CARE_PROVIDER_SITE_OTHER): Payer: Medicare PPO | Admitting: Cardiology

## 2021-05-20 ENCOUNTER — Other Ambulatory Visit: Payer: Self-pay

## 2021-05-20 ENCOUNTER — Encounter: Payer: Self-pay | Admitting: Cardiology

## 2021-05-20 VITALS — BP 128/80 | HR 91 | Ht 70.0 in | Wt 194.0 lb

## 2021-05-20 DIAGNOSIS — I441 Atrioventricular block, second degree: Secondary | ICD-10-CM | POA: Diagnosis not present

## 2021-05-20 NOTE — Progress Notes (Signed)
Electrophysiology Office Note   Date:  05/20/2021   ID:  Shadoe [place], DOB September 15, 1933, MRN [id number]  PCP:  Lowella Dandy, NP  Cardiologist:  Bettina Gavia Primary Electrophysiologist:  Zebulun Deman Meredith Leeds, MD    Chief Complaint: Second-degree AV block   History of Present Illness: Marcos Ruelas is a 87 y.o. male who is being seen today for the evaluation of second-degree AV block at the request of Moon, Amy A, NP. Presenting today for electrophysiology evaluation.  He has a history significant for second-degree AV block status post Medtronic dual-chamber pacemaker implanted 07/09/2018, SVT, hypertension.  He was diagnosed with atrial fibrillation on remote interrogation 01/09/2020 with a 0.3% burden.  He has a CHA2DS2-VASc of 3 and is now on Eliquis.  Today, denies symptoms of palpitations, chest pain, shortness of breath, orthopnea, PND, lower extremity edema, claudication, dizziness, presyncope, syncope, bleeding, or neurologic sequela. The patient is tolerating medications without difficulties.  He is overall feeling well.  He continues to go to the gym 3 days a week.  His only complaint is mild fatigue.  Aside from that, he has been doing well without issues.   Past Medical History:  Diagnosis Date   Bradycardia    Past Surgical History:  Procedure Laterality Date   PACEMAKER IMPLANT N/A 07/09/2018   Procedure: PACEMAKER IMPLANT;  Surgeon: Evans Lance, MD;  Location: [facility];  Service: Cardiovascular;  Laterality: N/A;   TONSILLECTOMY       Current Outpatient Medications  Medication Sig Dispense Refill   apixaban (ELIQUIS) 5 MG TABS tablet Take 1 tablet (5 mg total) by mouth 2 (two) times daily. 60 tablet 6   atorvastatin (LIPITOR) 20 MG tablet Take 1 tablet by mouth daily.     clindamycin (CLEOCIN) 300 MG capsule Take 300 mg by mouth every 6 (six) hours.     loratadine (CLARITIN) 10 MG tablet Take 10 mg by mouth daily as needed for allergies.     montelukast  (SINGULAIR) 10 MG tablet Take 10 mg by mouth daily.      No current facility-administered medications for this visit.    Allergies:   Penicillins   Social History:  The patient  reports that he quit smoking about 58 years ago. He has a 5.00 pack-year smoking history. He has never used smokeless tobacco. He reports current alcohol use of about 1.0 standard drink per week. He reports that he does not use drugs.   Family History:  The patient's family history includes COPD in his brother; Heart attack in his father.   ROS:  Please see the history of present illness.   Otherwise, review of systems is positive for none.   All other systems are reviewed and negative.   PHYSICAL EXAM: VS:  BP 128/80   Pulse 91   Ht 5\' 10"  (1.778 m)   Wt 194 lb (88 kg)   SpO2 98%   BMI 27.84 kg/m  , BMI Body mass index is 27.84 kg/m. GEN: Well nourished, well developed, in no acute distress  HEENT: normal  Neck: no JVD, carotid bruits, or masses Cardiac: RRR; no murmurs, rubs, or gallops,no edema  Respiratory:  clear to auscultation bilaterally, normal work of breathing GI: soft, nontender, nondistended, + BS MS: no deformity or atrophy  Skin: warm and dry, device site well healed Neuro:  Strength and sensation are intact Psych: euthymic mood, full affect  EKG:  EKG is ordered today. Personal review of the ekg ordered shows A  sense, V pace  Personal review of the device interrogation today. Results in [facility] [place]: No results found for requested labs within last 8760 hours.    Lipid Panel  No results found for: CHOL, TRIG, HDL, CHOLHDL, VLDL, LDLCALC, LDLDIRECT   Wt Readings from Last 3 Encounters:  05/20/21 194 lb (88 kg)  11/21/20 191 lb 6.4 oz (86.8 kg)  05/14/20 191 lb 6.4 oz (86.8 kg)      Other studies Reviewed: Additional studies/ records that were reviewed today include: TTE 07/10/19  Review of the above records today demonstrates:  - Left ventricle: The cavity size was  normal. Systolic function was    normal. The estimated ejection fraction was in the range of 60%    to 65%. Wall motion was normal; there were no regional wall    motion abnormalities.  - Mitral valve: Calcified annulus. Mildly thickened leaflets .    There was moderate regurgitation.  - Left atrium: The atrium was mildly dilated.  - Right atrium: The atrium was mildly dilated.    ASSESSMENT AND PLAN:  1.  Paroxysmal atrial fibrillation/SVT: CHA2DS2-VASc of 3.  Currently on Eliquis 5 mg twice daily.  Has had minimal episodes on device interrogation.  No changes at this time.  2.  Mobitz 2 AV block: Status post Medtronic dual-chamber pacemaker implanted in 2020.  Device functioning appropriately.  No changes at this time.  Current medicines are reviewed at length with the patient today.   The patient does not have concerns regarding his medicines.  The following changes were made today:  none  Labs/ tests ordered today include:  Orders Placed This Encounter  Procedures   EKG 12-Lead     Disposition:   FU with Christmas Faraci 1 year  Signed, Jarquis Walker Meredith Leeds, MD  05/20/2021 9:00 AM     [facility] [place] [address] [phone] (office) [phone] (fax)

## 2021-05-20 NOTE — Patient Instructions (Signed)
Medication Instructions:  Your physician recommends that you continue on your current medications as directed. Please refer to the Current Medication list given to you today.  *If you need a refill on your cardiac medications before your next appointment, please call your pharmacy*   Lab Work: None ordered   Testing/Procedures: None ordered   Follow-Up: At [facility], you and your health needs are our priority.  As part of our continuing mission to provide you with exceptional heart care, we have created designated Provider Care Teams.  These Care Teams include your primary Cardiologist (physician) and Advanced Practice Providers (APPs -  Physician Assistants and Nurse Practitioners) who all work together to provide you with the care you need, when you need it.  Remote monitoring is used to monitor your Pacemaker or ICD from home. This monitoring reduces the number of office visits required to check your device to one time per year. It allows [place] to keep an eye on the functioning of your device to ensure it is working properly. You are scheduled for a device check from home on 07/08/2021. You may send your transmission at any time that day. If you have a wireless device, the transmission will be sent automatically. After your physician reviews your transmission, you will receive a postcard with your next transmission date.  Your next appointment:   1 year(s)  The format for your next appointment:   In Person  Provider:   Allegra Lai, MD   Thank you for choosing [facility]!!   Trinidad Curet, RN [phone]    Other Instructions

## 2021-07-08 ENCOUNTER — Ambulatory Visit (INDEPENDENT_AMBULATORY_CARE_PROVIDER_SITE_OTHER): Payer: Medicare HMO

## 2021-07-08 DIAGNOSIS — I441 Atrioventricular block, second degree: Secondary | ICD-10-CM | POA: Diagnosis not present

## 2021-07-09 LAB — CUP PACEART REMOTE DEVICE CHECK
Battery Remaining Longevity: 102 mo
Battery Voltage: 3 V
Brady Statistic AP VP Percent: 0.72 %
Brady Statistic AP VS Percent: 0 %
Brady Statistic AS VP Percent: 99.07 %
Brady Statistic AS VS Percent: 0.21 %
Brady Statistic RA Percent Paced: 0.76 %
Brady Statistic RV Percent Paced: 99.79 %
Date Time Interrogation Session: 20230116115948
Implantable Lead Implant Date: 20200117
Implantable Lead Implant Date: 20200117
Implantable Lead Location: 753859
Implantable Lead Location: 753860
Implantable Lead Model: 3830
Implantable Lead Model: 5076
Implantable Pulse Generator Implant Date: 20200117
Lead Channel Impedance Value: 342 Ohm
Lead Channel Impedance Value: 399 Ohm
Lead Channel Impedance Value: 513 Ohm
Lead Channel Impedance Value: 551 Ohm
Lead Channel Pacing Threshold Amplitude: 0.625 V
Lead Channel Pacing Threshold Amplitude: 0.75 V
Lead Channel Pacing Threshold Pulse Width: 0.4 ms
Lead Channel Pacing Threshold Pulse Width: 0.4 ms
Lead Channel Sensing Intrinsic Amplitude: 17.875 mV
Lead Channel Sensing Intrinsic Amplitude: 17.875 mV
Lead Channel Sensing Intrinsic Amplitude: 7.875 mV
Lead Channel Sensing Intrinsic Amplitude: 7.875 mV
Lead Channel Setting Pacing Amplitude: 1.5 V
Lead Channel Setting Pacing Amplitude: 2.5 V
Lead Channel Setting Pacing Pulse Width: 0.4 ms
Lead Channel Setting Sensing Sensitivity: 2 mV

## 2021-07-22 NOTE — Progress Notes (Signed)
Remote pacemaker transmission.   

## 2021-08-08 DIAGNOSIS — R1032 Left lower quadrant pain: Secondary | ICD-10-CM | POA: Diagnosis not present

## 2021-08-08 DIAGNOSIS — Z6827 Body mass index (BMI) 27.0-27.9, adult: Secondary | ICD-10-CM | POA: Diagnosis not present

## 2021-08-08 DIAGNOSIS — K5900 Constipation, unspecified: Secondary | ICD-10-CM | POA: Diagnosis not present

## 2021-08-12 DIAGNOSIS — J309 Allergic rhinitis, unspecified: Secondary | ICD-10-CM | POA: Diagnosis not present

## 2021-08-12 DIAGNOSIS — Z6827 Body mass index (BMI) 27.0-27.9, adult: Secondary | ICD-10-CM | POA: Diagnosis not present

## 2021-08-12 DIAGNOSIS — I480 Paroxysmal atrial fibrillation: Secondary | ICD-10-CM | POA: Diagnosis not present

## 2021-08-12 DIAGNOSIS — E785 Hyperlipidemia, unspecified: Secondary | ICD-10-CM | POA: Diagnosis not present

## 2021-08-12 DIAGNOSIS — R1032 Left lower quadrant pain: Secondary | ICD-10-CM | POA: Diagnosis not present

## 2021-08-12 DIAGNOSIS — Z950 Presence of cardiac pacemaker: Secondary | ICD-10-CM | POA: Diagnosis not present

## 2021-09-30 ENCOUNTER — Telehealth: Payer: Self-pay

## 2021-09-30 DIAGNOSIS — M4722 Other spondylosis with radiculopathy, cervical region: Secondary | ICD-10-CM | POA: Diagnosis not present

## 2021-09-30 DIAGNOSIS — I771 Stricture of artery: Secondary | ICD-10-CM | POA: Diagnosis not present

## 2021-09-30 DIAGNOSIS — I672 Cerebral atherosclerosis: Secondary | ICD-10-CM | POA: Diagnosis not present

## 2021-09-30 DIAGNOSIS — I6523 Occlusion and stenosis of bilateral carotid arteries: Secondary | ICD-10-CM | POA: Diagnosis not present

## 2021-09-30 DIAGNOSIS — M47812 Spondylosis without myelopathy or radiculopathy, cervical region: Secondary | ICD-10-CM | POA: Diagnosis not present

## 2021-09-30 DIAGNOSIS — R079 Chest pain, unspecified: Secondary | ICD-10-CM | POA: Diagnosis not present

## 2021-09-30 DIAGNOSIS — R0789 Other chest pain: Secondary | ICD-10-CM | POA: Diagnosis not present

## 2021-09-30 NOTE — Telephone Encounter (Signed)
Kalie from the front desk informed me that she had a patient in the lobby stating he was having chest pain and refused to to go to the ER and wanted to speak with Dr. Joya Gaskins nurse about it. I went up to the front and brought the patient back to one of the rooms where we could talk in private. The patient reported to me that he had chest pain in the left upper chest which radiated down his left arm and up into his neck. At this point I informed him that he needed to go to the ER to be evaluated. The patient was very hesitant to go to the ER because he stated that he would sit there for half a day. I explained that since he had recently had chest and because of his symptoms that he needed to go to the ER to be evaluated. He reluctantly left the office and stated to his wife that they had to go to the ER. ?

## 2021-10-07 ENCOUNTER — Ambulatory Visit (INDEPENDENT_AMBULATORY_CARE_PROVIDER_SITE_OTHER): Payer: Medicare HMO

## 2021-10-07 DIAGNOSIS — I441 Atrioventricular block, second degree: Secondary | ICD-10-CM

## 2021-10-08 LAB — CUP PACEART REMOTE DEVICE CHECK
Battery Remaining Longevity: 97 mo
Battery Voltage: 2.99 V
Brady Statistic AP VP Percent: 1.31 %
Brady Statistic AP VS Percent: 0 %
Brady Statistic AS VP Percent: 96.82 %
Brady Statistic AS VS Percent: 1.87 %
Brady Statistic RA Percent Paced: 1.48 %
Brady Statistic RV Percent Paced: 98.13 %
Date Time Interrogation Session: 20230416222736
Implantable Lead Implant Date: 20200117
Implantable Lead Implant Date: 20200117
Implantable Lead Location: 753859
Implantable Lead Location: 753860
Implantable Lead Model: 3830
Implantable Lead Model: 5076
Implantable Pulse Generator Implant Date: 20200117
Lead Channel Impedance Value: 323 Ohm
Lead Channel Impedance Value: 380 Ohm
Lead Channel Impedance Value: 456 Ohm
Lead Channel Impedance Value: 513 Ohm
Lead Channel Pacing Threshold Amplitude: 0.625 V
Lead Channel Pacing Threshold Amplitude: 0.625 V
Lead Channel Pacing Threshold Pulse Width: 0.4 ms
Lead Channel Pacing Threshold Pulse Width: 0.4 ms
Lead Channel Sensing Intrinsic Amplitude: 17.875 mV
Lead Channel Sensing Intrinsic Amplitude: 17.875 mV
Lead Channel Sensing Intrinsic Amplitude: 7.875 mV
Lead Channel Sensing Intrinsic Amplitude: 7.875 mV
Lead Channel Setting Pacing Amplitude: 1.5 V
Lead Channel Setting Pacing Amplitude: 2.5 V
Lead Channel Setting Pacing Pulse Width: 0.4 ms
Lead Channel Setting Sensing Sensitivity: 2 mV

## 2021-10-14 NOTE — Progress Notes (Signed)
?Cardiology Office Note:   ? ?Date:  10/15/2021  ? ?ID:  Dillon Wallace, DOB 09-24-1933, MRN [id number] ? ?PCP:  Lowella Dandy, NP  ?Cardiologist:  Shirlee More, MD   ? ?Referring MD: Lowella Dandy, NP  ? ? ?ASSESSMENT:   ? ?1. Second degree AV block   ?2. Pacemaker   ?3. PAF (paroxysmal atrial fibrillation) ([place])   ?4. Pure hypercholesterolemia   ?5. Chest pain of uncertain etiology   ? ?PLAN:   ? ?In order of problems listed above: ? ?He is doing well with pacemaker therapy asymptomatic follows in our device clinic and will continue his anticoagulant but take it twice daily with device detected low burden atrial fibrillation ?Ideal lipids continue with statin ?For further evaluation myocardial perfusion study Lexiscan with paced rhythm in our office ? ? ?Next appointment: 1 year ? ? ?Medication Adjustments/Labs and Tests Ordered: ?Current medicines are reviewed at length with the patient today.  Concerns regarding medicines are outlined above.  ?No orders of the defined types were placed in this encounter. ? ?No orders of the defined types were placed in this encounter. ? ?Chief complaint I was recently at [facility] admitted with neck shoulder and chest pain ? ? ?History of Present Illness:   ? ?Dillon Wallace is a 88 y.o. male with a hx of  bradycardia and second-degree AV block with permanent pacemaker, SVT detected on device interrogation last seen 11/21/2020. ? ?He recently presented to [facility] with a predominant complaint of neck pain nonexertional radiates to left arm and also had chest discomfort.  His EKG personally reviewed showed him to be dual-chamber paced.  Troponin was normal admitted to the hospital CT showed cervical spine disc disease he did not have a cardiac evaluation.  He has been having chiropractic care and has not had any recurrent chest discomfort. ? ?He has been taking his anticoagulant once a day he agrees to take it twice daily. ? ?No palpitation or syncope ?Tolerates his statin  without muscle pain or weakness ?Recent labs 04/08/2021 cholesterol 142 LDL 43 ? ?Compliance with diet, lifestyle and medications: Yes ? ?His last loop interrogation 10/06/2021 showed brief atrial fibrillation 20 minutes 20 seconds.  Less than 1/10 of 1% . ?Past Medical History:  ?Diagnosis Date  ? Bradycardia   ? ? ?Past Surgical History:  ?Procedure Laterality Date  ? PACEMAKER IMPLANT N/A 07/09/2018  ? Procedure: PACEMAKER IMPLANT;  Surgeon: Evans Lance, MD;  Location: [facility];  Service: Cardiovascular;  Laterality: N/A;  ? TONSILLECTOMY    ? ? ?Current Medications: ?Current Meds  ?Medication Sig  ? apixaban (ELIQUIS) 5 MG TABS tablet Take 1 tablet (5 mg total) by mouth 2 (two) times daily. (Patient taking differently: Take 5 mg by mouth daily.)  ? atorvastatin (LIPITOR) 20 MG tablet Take 1 tablet by mouth daily.  ? montelukast (SINGULAIR) 10 MG tablet Take 10 mg by mouth daily.   ?  ? ?Allergies:   Penicillins  ? ?Social History  ? ?Socioeconomic History  ? Marital status: Married  ?  Spouse name: Not on file  ? Number of children: Not on file  ? Years of education: Not on file  ? Highest education level: Not on file  ?Occupational History  ? Not on file  ?Tobacco Use  ? Smoking status: Former  ?  Packs/day: 1.00  ?  Years: 5.00  ?  Pack years: 5.00  ?  Types: Cigarettes  ?  Quit date:  06/23/1962  ?  Years since quitting: 59.3  ?  Passive exposure: Past  ? Smokeless tobacco: Never  ?Vaping Use  ? Vaping Use: Never used  ?Substance and Sexual Activity  ? Alcohol use: Yes  ?  Alcohol/week: 1.0 standard drink  ?  Types: 1 Shots of liquor per week  ?  Comment: 1 weekly  ? Drug use: Never  ? Sexual activity: Not on file  ?Other Topics Concern  ? Not on file  ?Social History Narrative  ? Not on file  ? ?Social Determinants of Health  ? ?Financial Resource Strain: Not on file  ?Food Insecurity: Not on file  ?Transportation Needs: Not on file  ?Physical Activity: Not on file  ?Stress: Not on file  ?Social  Connections: Not on file  ?  ? ?Family History: ?The patient's family history includes COPD in his brother; Heart attack in his father. ?ROS:   ?Please see the history of present illness.    ?All other systems reviewed and are negative. ? ?EKGs/Labs/Other Studies Reviewed:   ? ?The following studies were reviewed today: ?See history ? ?Physical Exam:   ? ?VS:  BP (!) 144/86 (BP Location: Left Arm)   Ht '5\' 10"'$  (1.778 m)   Wt 194 lb 3.2 oz (88.1 kg)   BMI 27.86 kg/m?    ? ?Wt Readings from Last 3 Encounters:  ?10/15/21 194 lb 3.2 oz (88.1 kg)  ?05/20/21 194 lb (88 kg)  ?11/21/20 191 lb 6.4 oz (86.8 kg)  ?  ? ?GEN:  Well nourished, well developed in no acute distress ?HEENT: Normal ?NECK: No JVD; No carotid bruits ?LYMPHATICS: No lymphadenopathy ?CARDIAC: RRR, no murmurs, rubs, gallops ?RESPIRATORY:  Clear to auscultation without rales, wheezing or rhonchi  ?ABDOMEN: Soft, non-tender, non-distended ?MUSCULOSKELETAL:  No edema; No deformity  ?SKIN: Warm and dry ?NEUROLOGIC:  Alert and oriented x 3 ?PSYCHIATRIC:  Normal affect  ? ? ?Signed, ?Shirlee More, MD  ?10/15/2021 10:07 AM    ?[place]  ?

## 2021-10-15 ENCOUNTER — Encounter: Payer: Self-pay | Admitting: Cardiology

## 2021-10-15 ENCOUNTER — Ambulatory Visit (INDEPENDENT_AMBULATORY_CARE_PROVIDER_SITE_OTHER): Payer: Medicare HMO | Admitting: Cardiology

## 2021-10-15 ENCOUNTER — Telehealth: Payer: Self-pay | Admitting: *Deleted

## 2021-10-15 VITALS — BP 144/86 | HR 86 | Ht 70.0 in | Wt 194.2 lb

## 2021-10-15 DIAGNOSIS — R079 Chest pain, unspecified: Secondary | ICD-10-CM | POA: Diagnosis not present

## 2021-10-15 DIAGNOSIS — E7800 Pure hypercholesterolemia, unspecified: Secondary | ICD-10-CM

## 2021-10-15 DIAGNOSIS — I441 Atrioventricular block, second degree: Secondary | ICD-10-CM | POA: Diagnosis not present

## 2021-10-15 DIAGNOSIS — Z950 Presence of cardiac pacemaker: Secondary | ICD-10-CM

## 2021-10-15 DIAGNOSIS — D489 Neoplasm of uncertain behavior, unspecified: Secondary | ICD-10-CM | POA: Diagnosis not present

## 2021-10-15 DIAGNOSIS — I480 Paroxysmal atrial fibrillation: Secondary | ICD-10-CM

## 2021-10-15 NOTE — Patient Instructions (Signed)
Medication Instructions:  ?Your physician recommends that you continue on your current medications as directed. Please refer to the Current Medication list given to you today. ? ?*If you need a refill on your cardiac medications before your next appointment, please call your pharmacy* ? ? ?Lab Work: ?None ?If you have labs (blood work) drawn today and your tests are completely normal, you will receive your results only by: ?MyChart Message (if you have MyChart) OR ?A paper copy in the mail ?If you have any lab test that is abnormal or we need to change your treatment, we will call you to review the results. ? ? ?Testing/Procedures: ? ? ?[facility] ?[address] ?[place], [place] [address] ?Phone:  [phone] ? ? ? ?Please arrive 15 minutes prior to your appointment time for registration and insurance purposes. ? ?The test will take approximately 3 to 4 hours to complete; you may bring reading material.  If someone comes with you to your appointment, they will need to remain in the main lobby due to limited space in the testing area. **If you are pregnant or breastfeeding, please notify the nuclear lab prior to your appointment** ? ?How to prepare for your Myocardial Perfusion Test: ?Do not eat or drink 3 hours prior to your test, except you may have water. ?Do not consume products containing caffeine (regular or decaffeinated) 12 hours prior to your test. (ex: coffee, chocolate, sodas, tea). ?Do bring a list of your current medications with you.  If not listed below, you may take your medications as normal. ?Do wear comfortable clothes (no dresses or overalls) and walking shoes, tennis shoes preferred (No heels or open toe shoes are allowed). ?Do NOT wear cologne, perfume, aftershave, or lotions (deodorant is allowed). ?If these instructions are not followed, your test will have to be rescheduled. ? ?Please report to [address] for your test.  If you have questions or  concerns about your appointment, you can call the [facility] at [phone]. ? ?If you cannot keep your appointment, please provide 24 hours notification to the Nuclear Lab, to avoid a possible $50 charge to your account. ? ? ? ?Follow-Up: ?At [facility], you and your health needs are our priority.  As part of our continuing mission to provide you with exceptional heart care, we have created designated Provider Care Teams.  These Care Teams include your primary Cardiologist (physician) and Advanced Practice Providers (APPs -  Physician Assistants and Nurse Practitioners) who all work together to provide you with the care you need, when you need it. ? ?We recommend signing up for the patient portal called "MyChart".  Sign up information is provided on this After Visit Summary.  MyChart is used to connect with patients for Virtual Visits (Telemedicine).  Patients are able to view lab/test results, encounter notes, upcoming appointments, etc.  Non-urgent messages can be sent to your provider as well.   ?To learn more about what you can do with MyChart, go to [email or web address].   ? ?Your next appointment:   ?1 year(s) ? ?The format for your next appointment:   ?In Person ? ?Provider:   ?Shirlee More, MD  ? ? ?Other Instructions ?None ? ?Important Information About Sugar ? ? ? ? ?  ?

## 2021-10-15 NOTE — Telephone Encounter (Signed)
Left message on voicemail per DPR in reference to upcoming appointment scheduled on 10/22/21 at 1115 with detailed instructions given per Myocardial Perfusion Study Information Sheet for the test. LM to arrive 15 minutes early, and that it is imperative to arrive on time for appointment to keep from having the test rescheduled. If you need to cancel or reschedule your appointment, please call the office within 24 hours of your appointment. Failure to do so may result in a cancellation of your appointment, and a $50 no show fee. Phone number given for call back for any questions. No mychart. Shakenya Stoneberg, Ranae Palms ? ? ? ?

## 2021-10-17 ENCOUNTER — Other Ambulatory Visit: Payer: Self-pay

## 2021-10-17 NOTE — Addendum Note (Signed)
Addended byShirlee More on: 10/17/2021 02:37 PM ? ? Modules accepted: Orders ? ?

## 2021-10-22 ENCOUNTER — Ambulatory Visit (INDEPENDENT_AMBULATORY_CARE_PROVIDER_SITE_OTHER): Payer: Medicare HMO

## 2021-10-22 DIAGNOSIS — I441 Atrioventricular block, second degree: Secondary | ICD-10-CM

## 2021-10-22 DIAGNOSIS — R079 Chest pain, unspecified: Secondary | ICD-10-CM | POA: Diagnosis not present

## 2021-10-22 DIAGNOSIS — E7800 Pure hypercholesterolemia, unspecified: Secondary | ICD-10-CM | POA: Diagnosis not present

## 2021-10-22 DIAGNOSIS — Z950 Presence of cardiac pacemaker: Secondary | ICD-10-CM

## 2021-10-22 DIAGNOSIS — I480 Paroxysmal atrial fibrillation: Secondary | ICD-10-CM

## 2021-10-22 LAB — MYOCARDIAL PERFUSION IMAGING
LV dias vol: 83 mL (ref 62–150)
LV sys vol: 49 mL
Nuc Stress EF: 41 %
Peak HR: 117 {beats}/min
Rest HR: 79 {beats}/min
Rest Nuclear Isotope Dose: 10.5 mCi
SDS: 1
SRS: 17
SSS: 18
Stress Nuclear Isotope Dose: 29.2 mCi
TID: 1.17

## 2021-10-22 MED ORDER — TECHNETIUM TC 99M TETROFOSMIN IV KIT
29.2000 | PACK | Freq: Once | INTRAVENOUS | Status: AC | PRN
  Administered 2021-10-22: 29.2 via INTRAVENOUS

## 2021-10-22 MED ORDER — TECHNETIUM TC 99M TETROFOSMIN IV KIT
10.7000 | PACK | Freq: Once | INTRAVENOUS | Status: AC | PRN
  Administered 2021-10-22: 10.7 via INTRAVENOUS

## 2021-10-22 MED ORDER — REGADENOSON 0.4 MG/5ML IV SOLN
0.4000 mg | Freq: Once | INTRAVENOUS | Status: AC
  Administered 2021-10-22: 0.4 mg via INTRAVENOUS

## 2021-10-22 NOTE — Progress Notes (Unsigned)
? ?  Established Patient Office Visit ? ?Subjective   ?Patient ID: Albino Bufford, male    DOB: 21-Jan-1934  Age: 88 y.o. MRN: [id number] ? ?No chief complaint on file. ? ? ?HPI ? ?{History (Optional):23778} ? ?ROS ? ?  ?Objective:  ?  ? ?Ht '5\' 10"'$  (1.778 m)   Wt 194 lb (88 kg)   BMI 27.84 kg/m?  ?{Vitals History (Optional):23777} ? ?Physical Exam ? ? ?Results for orders placed or performed in visit on 10/22/21  ?MYOCARDIAL PERFUSION IMAGING  ?Result Value Ref Range  ? Rest Nuclear Isotope Dose 10.5 mCi  ? Stress Nuclear Isotope Dose 29.2 mCi  ? Rest HR 79.0 bpm  ? Rest BP 164/100 mmHg  ? Peak HR 117 bpm  ? Peak BP 153/92 mmHg  ? ? ?{Labs (Optional):23779} ? ?The ASCVD Risk score (Arnett DK, et al., 2019) failed to calculate for the following reasons: ?  The 2019 ASCVD risk score is only valid for ages 52 to 13 ? ?  ?Assessment & Plan:  ? ?Problem List Items Addressed This Visit   ? ? Pacemaker  ? ?Other Visit Diagnoses   ? ? Second degree AV block      ? PAF (paroxysmal atrial fibrillation) ([place])      ? Pure hypercholesterolemia      ? Chest pain of uncertain etiology      ? ?  ? ? ?No follow-ups on file.  ? ? ?Nahom Carfagno, Milinda Hirschfeld ? ? ?

## 2021-10-23 ENCOUNTER — Telehealth: Payer: Self-pay

## 2021-10-23 NOTE — Telephone Encounter (Signed)
-----   Message from Richardo Priest, MD sent at 10/23/2021 12:16 PM EDT ----- ?Overall good result no findings of previous infarction or severe blockage of the coronary arteries. ?

## 2021-10-23 NOTE — Telephone Encounter (Signed)
Spoke with patient wife(on DPR). Advised of results ?

## 2021-10-24 NOTE — Progress Notes (Signed)
Remote pacemaker transmission.   

## 2021-10-25 DIAGNOSIS — D485 Neoplasm of uncertain behavior of skin: Secondary | ICD-10-CM | POA: Diagnosis not present

## 2021-11-29 DIAGNOSIS — C44622 Squamous cell carcinoma of skin of right upper limb, including shoulder: Secondary | ICD-10-CM | POA: Diagnosis not present

## 2021-12-10 DIAGNOSIS — I480 Paroxysmal atrial fibrillation: Secondary | ICD-10-CM | POA: Diagnosis not present

## 2021-12-10 DIAGNOSIS — Z950 Presence of cardiac pacemaker: Secondary | ICD-10-CM | POA: Diagnosis not present

## 2021-12-10 DIAGNOSIS — E785 Hyperlipidemia, unspecified: Secondary | ICD-10-CM | POA: Diagnosis not present

## 2021-12-10 DIAGNOSIS — J309 Allergic rhinitis, unspecified: Secondary | ICD-10-CM | POA: Diagnosis not present

## 2021-12-10 DIAGNOSIS — Z79899 Other long term (current) drug therapy: Secondary | ICD-10-CM | POA: Diagnosis not present

## 2021-12-10 DIAGNOSIS — Z139 Encounter for screening, unspecified: Secondary | ICD-10-CM | POA: Diagnosis not present

## 2021-12-13 DIAGNOSIS — D485 Neoplasm of uncertain behavior of skin: Secondary | ICD-10-CM | POA: Diagnosis not present

## 2021-12-13 DIAGNOSIS — D2239 Melanocytic nevi of other parts of face: Secondary | ICD-10-CM | POA: Diagnosis not present

## 2021-12-13 DIAGNOSIS — L570 Actinic keratosis: Secondary | ICD-10-CM | POA: Diagnosis not present

## 2021-12-13 DIAGNOSIS — D225 Melanocytic nevi of trunk: Secondary | ICD-10-CM | POA: Diagnosis not present

## 2021-12-13 DIAGNOSIS — L821 Other seborrheic keratosis: Secondary | ICD-10-CM | POA: Diagnosis not present

## 2021-12-23 DIAGNOSIS — H35349 Macular cyst, hole, or pseudohole, unspecified eye: Secondary | ICD-10-CM | POA: Diagnosis not present

## 2022-01-07 ENCOUNTER — Ambulatory Visit (INDEPENDENT_AMBULATORY_CARE_PROVIDER_SITE_OTHER): Payer: Medicare HMO

## 2022-01-07 DIAGNOSIS — I441 Atrioventricular block, second degree: Secondary | ICD-10-CM | POA: Diagnosis not present

## 2022-01-07 LAB — CUP PACEART REMOTE DEVICE CHECK
Battery Remaining Longevity: 95 mo
Battery Voltage: 2.99 V
Brady Statistic AP VP Percent: 0.75 %
Brady Statistic AP VS Percent: 0 %
Brady Statistic AS VP Percent: 96.69 %
Brady Statistic AS VS Percent: 2.56 %
Brady Statistic RA Percent Paced: 0.94 %
Brady Statistic RV Percent Paced: 97.44 %
Date Time Interrogation Session: 20230718045359
Implantable Lead Implant Date: 20200117
Implantable Lead Implant Date: 20200117
Implantable Lead Location: 753859
Implantable Lead Location: 753860
Implantable Lead Model: 3830
Implantable Lead Model: 5076
Implantable Pulse Generator Implant Date: 20200117
Lead Channel Impedance Value: 342 Ohm
Lead Channel Impedance Value: 399 Ohm
Lead Channel Impedance Value: 437 Ohm
Lead Channel Impedance Value: 532 Ohm
Lead Channel Pacing Threshold Amplitude: 0.625 V
Lead Channel Pacing Threshold Amplitude: 0.625 V
Lead Channel Pacing Threshold Pulse Width: 0.4 ms
Lead Channel Pacing Threshold Pulse Width: 0.4 ms
Lead Channel Sensing Intrinsic Amplitude: 29.75 mV
Lead Channel Sensing Intrinsic Amplitude: 29.75 mV
Lead Channel Sensing Intrinsic Amplitude: 7.375 mV
Lead Channel Sensing Intrinsic Amplitude: 7.375 mV
Lead Channel Setting Pacing Amplitude: 1.5 V
Lead Channel Setting Pacing Amplitude: 2.5 V
Lead Channel Setting Pacing Pulse Width: 0.4 ms
Lead Channel Setting Sensing Sensitivity: 2 mV

## 2022-01-08 DIAGNOSIS — C44311 Basal cell carcinoma of skin of nose: Secondary | ICD-10-CM | POA: Diagnosis not present

## 2022-01-19 DIAGNOSIS — L509 Urticaria, unspecified: Secondary | ICD-10-CM | POA: Diagnosis not present

## 2022-02-04 NOTE — Progress Notes (Signed)
Remote pacemaker transmission.   

## 2022-02-28 DIAGNOSIS — C44622 Squamous cell carcinoma of skin of right upper limb, including shoulder: Secondary | ICD-10-CM | POA: Diagnosis not present

## 2022-02-28 DIAGNOSIS — C44311 Basal cell carcinoma of skin of nose: Secondary | ICD-10-CM | POA: Diagnosis not present

## 2022-02-28 DIAGNOSIS — L720 Epidermal cyst: Secondary | ICD-10-CM | POA: Diagnosis not present

## 2022-03-10 ENCOUNTER — Other Ambulatory Visit: Payer: Self-pay | Admitting: Cardiology

## 2022-03-11 NOTE — Telephone Encounter (Signed)
Prescription refill request for Eliquis received. Indication:Afib Last office visit:5/23 Scr:1.2 Age: 88 Weight:88 kg  Prescription refilled

## 2022-04-08 ENCOUNTER — Ambulatory Visit (INDEPENDENT_AMBULATORY_CARE_PROVIDER_SITE_OTHER): Payer: Medicare HMO

## 2022-04-08 DIAGNOSIS — I441 Atrioventricular block, second degree: Secondary | ICD-10-CM

## 2022-04-08 LAB — CUP PACEART REMOTE DEVICE CHECK
Battery Remaining Longevity: 91 mo
Battery Voltage: 2.98 V
Brady Statistic AP VP Percent: 1.28 %
Brady Statistic AP VS Percent: 0 %
Brady Statistic AS VP Percent: 96.92 %
Brady Statistic AS VS Percent: 1.79 %
Brady Statistic RA Percent Paced: 1.5 %
Brady Statistic RV Percent Paced: 98.21 %
Date Time Interrogation Session: 20231016234020
Implantable Lead Implant Date: 20200117
Implantable Lead Implant Date: 20200117
Implantable Lead Location: 753859
Implantable Lead Location: 753860
Implantable Lead Model: 3830
Implantable Lead Model: 5076
Implantable Pulse Generator Implant Date: 20200117
Lead Channel Impedance Value: 342 Ohm
Lead Channel Impedance Value: 380 Ohm
Lead Channel Impedance Value: 513 Ohm
Lead Channel Impedance Value: 513 Ohm
Lead Channel Pacing Threshold Amplitude: 0.625 V
Lead Channel Pacing Threshold Amplitude: 0.75 V
Lead Channel Pacing Threshold Pulse Width: 0.4 ms
Lead Channel Pacing Threshold Pulse Width: 0.4 ms
Lead Channel Sensing Intrinsic Amplitude: 29.75 mV
Lead Channel Sensing Intrinsic Amplitude: 29.75 mV
Lead Channel Sensing Intrinsic Amplitude: 8.125 mV
Lead Channel Sensing Intrinsic Amplitude: 8.125 mV
Lead Channel Setting Pacing Amplitude: 1.5 V
Lead Channel Setting Pacing Amplitude: 2.5 V
Lead Channel Setting Pacing Pulse Width: 0.4 ms
Lead Channel Setting Sensing Sensitivity: 2 mV

## 2022-04-17 DIAGNOSIS — E785 Hyperlipidemia, unspecified: Secondary | ICD-10-CM | POA: Diagnosis not present

## 2022-04-17 DIAGNOSIS — I480 Paroxysmal atrial fibrillation: Secondary | ICD-10-CM | POA: Diagnosis not present

## 2022-04-17 DIAGNOSIS — Z23 Encounter for immunization: Secondary | ICD-10-CM | POA: Diagnosis not present

## 2022-04-17 DIAGNOSIS — Z79899 Other long term (current) drug therapy: Secondary | ICD-10-CM | POA: Diagnosis not present

## 2022-04-17 DIAGNOSIS — Z1331 Encounter for screening for depression: Secondary | ICD-10-CM | POA: Diagnosis not present

## 2022-04-17 DIAGNOSIS — Z139 Encounter for screening, unspecified: Secondary | ICD-10-CM | POA: Diagnosis not present

## 2022-04-17 DIAGNOSIS — J309 Allergic rhinitis, unspecified: Secondary | ICD-10-CM | POA: Diagnosis not present

## 2022-04-17 DIAGNOSIS — Z9181 History of falling: Secondary | ICD-10-CM | POA: Diagnosis not present

## 2022-04-17 DIAGNOSIS — Z950 Presence of cardiac pacemaker: Secondary | ICD-10-CM | POA: Diagnosis not present

## 2022-04-23 NOTE — Progress Notes (Signed)
Remote pacemaker transmission.   

## 2022-05-02 DIAGNOSIS — C44311 Basal cell carcinoma of skin of nose: Secondary | ICD-10-CM | POA: Diagnosis not present

## 2022-07-08 ENCOUNTER — Ambulatory Visit: Payer: Medicare HMO | Attending: Cardiology

## 2022-07-08 DIAGNOSIS — I441 Atrioventricular block, second degree: Secondary | ICD-10-CM | POA: Diagnosis not present

## 2022-07-08 LAB — CUP PACEART REMOTE DEVICE CHECK
Battery Remaining Longevity: 88 mo
Battery Voltage: 2.98 V
Brady Statistic AP VP Percent: 0.54 %
Brady Statistic AP VS Percent: 0 %
Brady Statistic AS VP Percent: 97.73 %
Brady Statistic AS VS Percent: 1.72 %
Brady Statistic RA Percent Paced: 0.64 %
Brady Statistic RV Percent Paced: 98.27 %
Date Time Interrogation Session: 20240116042036
Implantable Lead Connection Status: 753985
Implantable Lead Connection Status: 753985
Implantable Lead Implant Date: 20200117
Implantable Lead Implant Date: 20200117
Implantable Lead Location: 753859
Implantable Lead Location: 753860
Implantable Lead Model: 3830
Implantable Lead Model: 5076
Implantable Pulse Generator Implant Date: 20200117
Lead Channel Impedance Value: 323 Ohm
Lead Channel Impedance Value: 380 Ohm
Lead Channel Impedance Value: 475 Ohm
Lead Channel Impedance Value: 513 Ohm
Lead Channel Pacing Threshold Amplitude: 0.625 V
Lead Channel Pacing Threshold Amplitude: 0.75 V
Lead Channel Pacing Threshold Pulse Width: 0.4 ms
Lead Channel Pacing Threshold Pulse Width: 0.4 ms
Lead Channel Sensing Intrinsic Amplitude: 29.75 mV
Lead Channel Sensing Intrinsic Amplitude: 29.75 mV
Lead Channel Sensing Intrinsic Amplitude: 7.75 mV
Lead Channel Sensing Intrinsic Amplitude: 7.75 mV
Lead Channel Setting Pacing Amplitude: 1.5 V
Lead Channel Setting Pacing Amplitude: 2.5 V
Lead Channel Setting Pacing Pulse Width: 0.4 ms
Lead Channel Setting Sensing Sensitivity: 2 mV
Zone Setting Status: 755011
Zone Setting Status: 755011

## 2022-08-01 NOTE — Progress Notes (Signed)
Remote pacemaker transmission.   

## 2022-08-19 DIAGNOSIS — Z950 Presence of cardiac pacemaker: Secondary | ICD-10-CM | POA: Diagnosis not present

## 2022-08-19 DIAGNOSIS — Z79899 Other long term (current) drug therapy: Secondary | ICD-10-CM | POA: Diagnosis not present

## 2022-08-19 DIAGNOSIS — I480 Paroxysmal atrial fibrillation: Secondary | ICD-10-CM | POA: Diagnosis not present

## 2022-08-19 DIAGNOSIS — E785 Hyperlipidemia, unspecified: Secondary | ICD-10-CM | POA: Diagnosis not present

## 2022-08-19 DIAGNOSIS — J309 Allergic rhinitis, unspecified: Secondary | ICD-10-CM | POA: Diagnosis not present

## 2022-08-19 DIAGNOSIS — E559 Vitamin D deficiency, unspecified: Secondary | ICD-10-CM | POA: Diagnosis not present

## 2022-09-22 ENCOUNTER — Encounter: Payer: Medicare HMO | Admitting: Cardiology

## 2022-10-07 ENCOUNTER — Ambulatory Visit (INDEPENDENT_AMBULATORY_CARE_PROVIDER_SITE_OTHER): Payer: Medicare HMO

## 2022-10-07 DIAGNOSIS — I441 Atrioventricular block, second degree: Secondary | ICD-10-CM | POA: Diagnosis not present

## 2022-10-08 LAB — CUP PACEART REMOTE DEVICE CHECK
Battery Remaining Longevity: 85 mo
Battery Voltage: 2.98 V
Brady Statistic AP VP Percent: 1.21 %
Brady Statistic AP VS Percent: 0 %
Brady Statistic AS VP Percent: 96.45 %
Brady Statistic AS VS Percent: 2.34 %
Brady Statistic RA Percent Paced: 1.39 %
Brady Statistic RV Percent Paced: 97.66 %
Date Time Interrogation Session: 20240415223358
Implantable Lead Connection Status: 753985
Implantable Lead Connection Status: 753985
Implantable Lead Implant Date: 20200117
Implantable Lead Implant Date: 20200117
Implantable Lead Location: 753859
Implantable Lead Location: 753860
Implantable Lead Model: 3830
Implantable Lead Model: 5076
Implantable Pulse Generator Implant Date: 20200117
Lead Channel Impedance Value: 323 Ohm
Lead Channel Impedance Value: 399 Ohm
Lead Channel Impedance Value: 418 Ohm
Lead Channel Impedance Value: 532 Ohm
Lead Channel Pacing Threshold Amplitude: 0.5 V
Lead Channel Pacing Threshold Amplitude: 0.75 V
Lead Channel Pacing Threshold Pulse Width: 0.4 ms
Lead Channel Pacing Threshold Pulse Width: 0.4 ms
Lead Channel Sensing Intrinsic Amplitude: 29.75 mV
Lead Channel Sensing Intrinsic Amplitude: 29.75 mV
Lead Channel Sensing Intrinsic Amplitude: 8.125 mV
Lead Channel Sensing Intrinsic Amplitude: 8.125 mV
Lead Channel Setting Pacing Amplitude: 1.5 V
Lead Channel Setting Pacing Amplitude: 2.5 V
Lead Channel Setting Pacing Pulse Width: 0.4 ms
Lead Channel Setting Sensing Sensitivity: 2 mV
Zone Setting Status: 755011
Zone Setting Status: 755011

## 2022-10-10 ENCOUNTER — Ambulatory Visit: Payer: Medicare HMO | Attending: Cardiology | Admitting: Cardiology

## 2022-10-10 ENCOUNTER — Encounter: Payer: Self-pay | Admitting: Cardiology

## 2022-10-10 VITALS — BP 128/82 | HR 84 | Ht 70.0 in | Wt 196.0 lb

## 2022-10-10 DIAGNOSIS — D6869 Other thrombophilia: Secondary | ICD-10-CM

## 2022-10-10 DIAGNOSIS — I480 Paroxysmal atrial fibrillation: Secondary | ICD-10-CM

## 2022-10-10 DIAGNOSIS — I441 Atrioventricular block, second degree: Secondary | ICD-10-CM | POA: Diagnosis not present

## 2022-10-10 NOTE — Progress Notes (Unsigned)
Cardiology Office Note:    Date:  10/13/2022   ID:  Dillon Wallace, DOB 05/08/1934, MRN [id number]  PCP:  Dillon Party, NP  Cardiologist:  Norman Herrlich, MD    Referring MD: Dillon Party, NP    ASSESSMENT:    1. Paroxysmal atrial fibrillation   2. Second degree AV block   3. Pacemaker   4. LV dysfunction    PLAN:    In order of problems listed above:  Dillon Wallace continues to do well predominantly sinus rhythm continue his anticoagulation Normal pacemaker function Continue statin lipids at target We will recheck an echocardiogram if EF truly is reduced will need to consider guideline directed treatment   Next appointment: 9 months from   Medication Adjustments/Labs and Tests Ordered: Current medicines are reviewed at length with the patient today.  Concerns regarding medicines are outlined above.  No orders of the defined types were placed in this encounter.  No orders of the defined types were placed in this encounter.   No chief complaint on file.   History of Present Illness:    Dillon Wallace is a 89 y.o. male with a hx of bradycardia and second-degree AV block with permanent pacemaker and SVT and atrial fibrillation with anticoagulation detected on device interrogation last seen 09/25/2021.  Following that visit he underwent a myocardial perfusion study reported 10/22/2021 showing EF of 41% and no finding of ischemia.  Previous echocardiogram January 2020 EF 60 to 65%.  Compliance with diet, lifestyle and medications: Yes  Remains very vigorous active and is having no problems with exercise intolerance edema shortness of breath chest pain palpitation or syncope He tolerates his statin without muscle pain or weakness He has had no bleeding from his anticoagulant He follows in our device clinic his EKG from last week shows normal dual-chamber pacemaker function independently reviewed Past Medical History:  Diagnosis Date   Bradycardia     Past Surgical History:   Procedure Laterality Date   PACEMAKER IMPLANT N/A 07/09/2018   Procedure: PACEMAKER IMPLANT;  Surgeon: Marinus Maw, MD;  Location: [facility];  Service: Cardiovascular;  Laterality: N/A;   TONSILLECTOMY      Current Medications: Current Meds  Medication Sig   atorvastatin (LIPITOR) 20 MG tablet Take 1 tablet by mouth daily.   ELIQUIS 5 MG TABS tablet TAKE ONE TABLET BY MOUTH TWICE DAILY   montelukast (SINGULAIR) 10 MG tablet Take 10 mg by mouth daily.      Allergies:   Penicillins   Social History   Socioeconomic History   Marital status: Married    Spouse name: Not on file   Number of children: Not on file   Years of education: Not on file   Highest education level: Not on file  Occupational History   Not on file  Tobacco Use   Smoking status: Former    Packs/day: 1.00    Years: 5.00    Additional pack years: 0.00    Total pack years: 5.00    Types: Cigarettes    Quit date: 06/23/1962    Years since quitting: 60.3    Passive exposure: Past   Smokeless tobacco: Never  Vaping Use   Vaping Use: Never used  Substance and Sexual Activity   Alcohol use: Yes    Alcohol/week: 1.0 standard drink of alcohol    Types: 1 Shots of liquor per week    Comment: 1 weekly   Drug use: Never   Sexual activity: Not  on file  Other Topics Concern   Not on file  Social History Narrative   Not on file   Social Determinants of Health   Financial Resource Strain: Not on file  Food Insecurity: Not on file  Transportation Needs: Not on file  Physical Activity: Not on file  Stress: Not on file  Social Connections: Not on file     Family History: The patient's family history includes COPD in his brother; Heart attack in his father. ROS:   Please see the history of present illness.    All other systems reviewed and are negative.  EKGs/Labs/Other Studies Reviewed:    The following studies were reviewed today:  Cardiac Studies & Procedures     STRESS  TESTS  MYOCARDIAL PERFUSION IMAGING 10/22/2021  Narrative   No evidence of ischemia. The study is intermediate risk.   Left ventricular function is abnormal. Nuclear stress EF: 41 %. The left ventricular ejection fraction is moderately decreased (30-44%). End diastolic cavity size is mildly enlarged.   Prior study not available for comparison.   ECHOCARDIOGRAM  ECHOCARDIOGRAM COMPLETE 07/09/2018  Narrative *[place]* *[facility]* [address] [place], [place] [address] [phone]  ------------------------------------------------------------------- Transthoracic Echocardiography  Patient:    Tallen, Schnorr MR #:       [id number] Study Date: 07/09/2018 Gender:     M Age:        89 Height:     177.8 cm Weight:     83.1 kg BSA:        2.04 m^2 Pt. Status: Room:  ADMITTING    Orpah Cobb, MD ATTENDING    Orpah Cobb, MD ORDERING     Orpah Cobb, MD PERFORMING   Orpah Cobb, MD REFERRING    Orpah Cobb, MD SONOGRAPHER  Ilona Sorrel  cc:  ------------------------------------------------------------------- LV EF: 60% -   65%  ------------------------------------------------------------------- Indications:      Syncope 780.2.  ------------------------------------------------------------------- History:   PMH:  Pre-syncope.  Syncope.  ------------------------------------------------------------------- Study Conclusions  - Left ventricle: The cavity size was normal. Systolic function was normal. The estimated ejection fraction was in the range of 60% to 65%. Wall motion was normal; there were no regional wall motion abnormalities. - Mitral valve: Calcified annulus. Mildly thickened leaflets . There was moderate regurgitation. - Left atrium: The atrium was mildly dilated. - Right atrium: The atrium was mildly dilated.  ------------------------------------------------------------------- Study data:  No prior study was available for comparison.   Study status:  Routine.  Procedure:  The patient reported no pain pre or post test. Transthoracic echocardiography. Image quality was adequate.  Study completion:  There were no complications. Transthoracic echocardiography.  M-mode, complete 2D, spectral Doppler, and color Doppler.  Birthdate:  Patient birthdate: [birth year].  Age:  Patient is 89 yr old.  Sex:  Gender: male. BMI: 26.3 kg/m^2.  Blood pressure:     149/77  Patient status: Inpatient.  Study date:  Study date: 07/09/2018. Study time: 07:54 AM.  Location:  Bedside.  -------------------------------------------------------------------  ------------------------------------------------------------------- Left ventricle:  The cavity size was normal. Systolic function was normal. The estimated ejection fraction was in the range of 60% to 65%. Wall motion was normal; there were no regional wall motion abnormalities.  ------------------------------------------------------------------- Aortic valve:   Trileaflet; moderately thickened, severely calcified leaflets. Mobility was not restricted. Sclerosis without stenosis.  Doppler:  Transvalvular velocity was within the normal range. There was no stenosis. There was no regurgitation.  ------------------------------------------------------------------- Aorta:  Aortic root: The aortic root was  normal in size.  ------------------------------------------------------------------- Mitral valve:   Calcified annulus. Mildly thickened leaflets . Mobility was not restricted.  Doppler:  Transvalvular velocity was within the normal range. There was no evidence for stenosis. There was moderate regurgitation.    Peak gradient (D): 5 mm Hg.  ------------------------------------------------------------------- Left atrium:  The atrium was mildly dilated.  ------------------------------------------------------------------- Right ventricle:  The cavity size was normal. Wall thickness was normal.  The moderator band was prominent. Systolic function was normal.  ------------------------------------------------------------------- Pulmonic valve:    The valve appears to be grossly normal. Doppler:  Transvalvular velocity was within the normal range. There was no evidence for stenosis.  ------------------------------------------------------------------- Tricuspid valve:   Structurally normal valve.    Doppler: Transvalvular velocity was within the normal range. There was mild regurgitation.  ------------------------------------------------------------------- Pulmonary artery:   The main pulmonary artery was normal-sized. Systolic pressure was within the normal range.  ------------------------------------------------------------------- Right atrium:  The atrium was mildly dilated.  ------------------------------------------------------------------- Pericardium:  There was no pericardial effusion.  ------------------------------------------------------------------- Systemic veins: Inferior vena cava: The vessel was normal in size. The respirophasic diameter changes were in the normal range (>= 50%), consistent with normal central venous pressure.  ------------------------------------------------------------------- Measurements  Left ventricle                         Value        Reference LV ID, ED, PLAX chordal                52    mm     43 - 52 LV ID, ES, PLAX chordal                33    mm     23 - 38 LV fx shortening, PLAX chordal         37    %      >=29 LV PW thickness, ED                    9     mm     ---------- IVS/LV PW ratio, ED                    0.89         <=1.3 Stroke volume, 2D                      87    ml     ---------- Stroke volume/bsa, 2D                  43    ml/m^2 ---------- LV e&', lateral                         12.2  cm/s   ---------- LV E/e&', lateral                       9.51         ---------- LV e&', medial                          13.3   cm/s   ---------- LV E/e&', medial                        8.72         ---------- LV e&', average  12.75 cm/s   ---------- LV E/e&', average                       9.1          ----------  Ventricular septum                     Value        Reference IVS thickness, ED                      8     mm     ----------  LVOT                                   Value        Reference LVOT ID, S                             22    mm     ---------- LVOT area                              3.8   cm^2   ---------- LVOT peak velocity, S                  99    cm/s   ---------- LVOT mean velocity, S                  57.7  cm/s   ---------- LVOT VTI, S                            22.9  cm     ----------  Aorta                                  Value        Reference Aortic root ID, ED                     33    mm     ----------  Left atrium                            Value        Reference LA ID, A-P, ES                         41    mm     ---------- LA ID/bsa, A-P                         2.01  cm/m^2 <=2.2 LA volume, S                           47.6  ml     ---------- LA volume/bsa, S                       23.4  ml/m^2 ---------- LA volume, ES, 1-p A4C                 42.6  ml     ---------- LA volume/bsa, ES, 1-p A4C             20.9  ml/m^2 ---------- LA volume, ES, 1-p A2C                 51.8  ml     ---------- LA volume/bsa, ES, 1-p A2C             25.4  ml/m^2 ----------  Mitral valve                           Value        Reference Mitral E-wave peak velocity            116   cm/s   ---------- Mitral A-wave peak velocity            71.6  cm/s   ---------- Mitral deceleration time       (L)     100   ms     150 - 230 Mitral peak gradient, D                5     mm Hg  ---------- Mitral E/A ratio, peak                 1.6          ----------  Right atrium                           Value        Reference RA ID, S-I, ES, A4C            (H)     57.9  mm     34 - 49 RA area,  ES, A4C                       15    cm^2   8.3 - 19.5 RA volume, ES, A/L                     33.9  ml     ---------- RA volume/bsa, ES, A/L                 16.6  ml/m^2 ----------  Systemic veins                         Value        Reference Estimated CVP                          8     mm Hg  ----------  Right ventricle                        Value        Reference TAPSE                                  23.9  mm     ---------- RV s&', lateral, S                      9.36  cm/s   ----------  Legend: (L)  and  (H)  mark values outside specified reference range.  -------------------------------------------------------------------  Prepared and Electronically Authenticated by  Orpah Cobb, MD 2020-01-17T11:54:15             08/19/2022 cholesterol 152 LDL 90 hemoglobin 14.1 creatinine mildly elevated 1.22 potassium  Physical Exam:    VS:  BP 136/82 (BP Location: Right Arm, Patient Position: Sitting)   Pulse 96   Ht 5\' 10"  (1.778 m)   Wt 198 lb 3.2 oz (89.9 kg)   BMI 28.44 kg/m     Wt Readings from Last 3 Encounters:  10/13/22 198 lb 3.2 oz (89.9 kg)  10/10/22 196 lb (88.9 kg)  10/22/21 194 lb (88 kg)     GEN:  Well nourished, well developed in no acute distress HEENT: Normal NECK: No JVD; No carotid bruits LYMPHATICS: No lymphadenopathy CARDIAC: RRR, no murmurs, rubs, gallops RESPIRATORY:  Clear to auscultation without rales, wheezing or rhonchi  ABDOMEN: Soft, non-tender, non-distended MUSCULOSKELETAL:  No edema; No deformity  SKIN: Warm and dry NEUROLOGIC:  Alert and oriented x 3 PSYCHIATRIC:  Normal affect    Signed, Norman Herrlich, MD  10/13/2022 8:44 AM    [facility]

## 2022-10-10 NOTE — Progress Notes (Signed)
Electrophysiology Office Note   Date:  10/10/2022   ID:  Dillon Wallace, DOB 18-Jun-1934, MRN [id number]  PCP:  Hurshel Party, NP  Cardiologist:  Dulce Sellar Primary Electrophysiologist:  Regan Lemming, MD    Chief Complaint: Second-degree AV block   History of Present Illness: Dillon Wallace is a 89 y.o. male who is being seen today for the evaluation of second-degree AV block at the request of Moon, Amy A, NP. Presenting today for electrophysiology evaluation.  He has a history significant for second-degree AV block post Medtronic dual-chamber pacemaker implanted 07/09/2018, SVT, hypertension.  He was diagnosed with atrial fibrillation on remote interrogation.  He has a CHA2DS2-VASc of 3 and is on Eliquis.  Today, denies symptoms of palpitations, chest pain, shortness of breath, orthopnea, PND, lower extremity edema, claudication, dizziness, presyncope, syncope, bleeding, or neurologic sequela. The patient is tolerating medications without difficulties.     Past Medical History:  Diagnosis Date   Bradycardia    Past Surgical History:  Procedure Laterality Date   PACEMAKER IMPLANT N/A 07/09/2018   Procedure: PACEMAKER IMPLANT;  Surgeon: Marinus Maw, MD;  Location: [facility];  Service: Cardiovascular;  Laterality: N/A;   TONSILLECTOMY       Current Outpatient Medications  Medication Sig Dispense Refill   atorvastatin (LIPITOR) 20 MG tablet Take 1 tablet by mouth daily.     ELIQUIS 5 MG TABS tablet TAKE ONE TABLET BY MOUTH TWICE DAILY 60 tablet 6   montelukast (SINGULAIR) 10 MG tablet Take 10 mg by mouth daily.      No current facility-administered medications for this visit.    Allergies:   Penicillins   Social History:  The patient  reports that he quit smoking about 60 years ago. His smoking use included cigarettes. He has a 5.00 pack-year smoking history. He has been exposed to tobacco smoke. He has never used smokeless tobacco. He reports current alcohol use of  about 1.0 standard drink of alcohol per week. He reports that he does not use drugs.   Family History:  The patient's family history includes COPD in his brother; Heart attack in his father.   ROS:  Please see the history of present illness.   Otherwise, review of systems is positive for none.   All other systems are reviewed and negative.   PHYSICAL EXAM: VS:  BP 128/82   Pulse 84   Ht  (1.778 m)   Wt 196 lb (88.9 kg)   BMI 28.12 kg/m  , BMI Body mass index is 28.12 kg/m. GEN: Well nourished, well developed, in no acute distress  HEENT: normal  Neck: no JVD, carotid bruits, or masses Cardiac: RRR; no murmurs, rubs, or gallops,no edema  Respiratory:  clear to auscultation bilaterally, normal work of breathing GI: soft, nontender, nondistended, + BS MS: no deformity or atrophy  Skin: warm and dry, device site well healed Neuro:  Strength and sensation are intact Psych: euthymic mood, full affect  EKG:  EKG is ordered today. Personal review of the ekg ordered shows A sense, V pace  Personal review of the device interrogation today. Results in Paceart   Recent Labs: No results found for requested labs within last 365 days.    Lipid Panel  No results found for: "CHOL", "TRIG", "HDL", "CHOLHDL", "VLDL", "LDLCALC", "LDLDIRECT"   Wt Readings from Last 3 Encounters:  10/10/22 196 lb (88.9 kg)  10/22/21 194 lb (88 kg)  10/15/21 194 lb 3.2 oz (88.1 kg)  Other studies Reviewed: Additional studies/ records that were reviewed today include: TTE 07/10/19  Review of the above records today demonstrates:  - Left ventricle: The cavity size was normal. Systolic function was    normal. The estimated ejection fraction was in the range of 60%    to 65%. Wall motion was normal; there were no regional wall    motion abnormalities.  - Mitral valve: Calcified annulus. Mildly thickened leaflets .    There was moderate regurgitation.  - Left atrium: The atrium was mildly dilated.   - Right atrium: The atrium was mildly dilated.    ASSESSMENT AND PLAN:  1.  Paroxysmal atrial fibrillation/SVT: CHA2DS2-VASc of 3.  Currently on Eliquis 5 mg twice daily.  Minimal noted on device interrogation.  No changes.  2.  Mobitz 2 second-degree AV block: Status post Medtronic dual-chamber pacemaker implant in 2020.  Device functioning appropriately.  Sensing, threshold, impedance within normal limits.  As he has had episodes of atrial flutter, have turned atrial therapies on.  3.  Secondary hypercoagulable state: Currently on Eliquis for atrial fibrillation  Current medicines are reviewed at length with the patient today.   The patient does not have concerns regarding his medicines.  The following changes were made today: None  Labs/ tests ordered today include:  Orders Placed This Encounter  Procedures   EKG 12-Lead     Disposition:   FU with [employer] 1 year  Signed, Issabelle Mcraney Jorja Loa, MD  10/10/2022 3:17 PM     [facility] [address] [place] [address] [phone] (office) [phone] (fax)

## 2022-10-13 ENCOUNTER — Ambulatory Visit: Payer: Medicare HMO | Attending: Cardiology | Admitting: Cardiology

## 2022-10-13 ENCOUNTER — Encounter: Payer: Self-pay | Admitting: Cardiology

## 2022-10-13 VITALS — BP 136/82 | HR 96 | Ht 70.0 in | Wt 198.2 lb

## 2022-10-13 DIAGNOSIS — I519 Heart disease, unspecified: Secondary | ICD-10-CM

## 2022-10-13 DIAGNOSIS — I441 Atrioventricular block, second degree: Secondary | ICD-10-CM

## 2022-10-13 DIAGNOSIS — I480 Paroxysmal atrial fibrillation: Secondary | ICD-10-CM

## 2022-10-13 DIAGNOSIS — Z950 Presence of cardiac pacemaker: Secondary | ICD-10-CM

## 2022-10-13 NOTE — Patient Instructions (Signed)
Medication Instructions:  Your physician recommends that you continue on your current medications as directed. Please refer to the Current Medication list given to you today.  *If you need a refill on your cardiac medications before your next appointment, please call your pharmacy*   Lab Work: None If you have labs (blood work) drawn today and your tests are completely normal, you will receive your results only by: MyChart Message (if you have MyChart) OR A paper copy in the mail If you have any lab test that is abnormal or we need to change your treatment, we will call you to review the results.   Testing/Procedures: None   Follow-Up: At  [facility]Care, you and your health needs are our priority.  As part of our continuing mission to provide you with exceptional heart care, we have created designated Provider Care Teams.  These Care Teams include your primary Cardiologist (physician) and Advanced Practice Providers (APPs -  Physician Assistants and Nurse Practitioners) who all work together to provide you with the care you need, when you need it.  We recommend signing up for the patient portal called "MyChart".  Sign up information is provided on this After Visit Summary.  MyChart is used to connect with patients for Virtual Visits (Telemedicine).  Patients are able to view lab/test results, encounter notes, upcoming appointments, etc.  Non-urgent messages can be sent to your provider as well.   To learn more about what you can do with MyChart, go to [email or web address].    Your next appointment:   9 month(s)  Provider:   Brian Munley, MD    Other Instructions None  

## 2022-11-10 NOTE — Progress Notes (Signed)
Remote pacemaker transmission.   

## 2022-12-02 DIAGNOSIS — E785 Hyperlipidemia, unspecified: Secondary | ICD-10-CM | POA: Diagnosis not present

## 2022-12-02 DIAGNOSIS — J309 Allergic rhinitis, unspecified: Secondary | ICD-10-CM | POA: Diagnosis not present

## 2022-12-02 DIAGNOSIS — I480 Paroxysmal atrial fibrillation: Secondary | ICD-10-CM | POA: Diagnosis not present

## 2022-12-02 DIAGNOSIS — Z950 Presence of cardiac pacemaker: Secondary | ICD-10-CM | POA: Diagnosis not present

## 2022-12-02 DIAGNOSIS — E559 Vitamin D deficiency, unspecified: Secondary | ICD-10-CM | POA: Diagnosis not present

## 2022-12-02 DIAGNOSIS — Z79899 Other long term (current) drug therapy: Secondary | ICD-10-CM | POA: Diagnosis not present

## 2022-12-12 DIAGNOSIS — D2239 Melanocytic nevi of other parts of face: Secondary | ICD-10-CM | POA: Diagnosis not present

## 2022-12-12 DIAGNOSIS — D225 Melanocytic nevi of trunk: Secondary | ICD-10-CM | POA: Diagnosis not present

## 2022-12-12 DIAGNOSIS — D485 Neoplasm of uncertain behavior of skin: Secondary | ICD-10-CM | POA: Diagnosis not present

## 2022-12-12 DIAGNOSIS — L570 Actinic keratosis: Secondary | ICD-10-CM | POA: Diagnosis not present

## 2022-12-29 ENCOUNTER — Other Ambulatory Visit: Payer: Self-pay | Admitting: Cardiology

## 2022-12-31 DIAGNOSIS — H35349 Macular cyst, hole, or pseudohole, unspecified eye: Secondary | ICD-10-CM | POA: Diagnosis not present

## 2023-01-06 ENCOUNTER — Ambulatory Visit (INDEPENDENT_AMBULATORY_CARE_PROVIDER_SITE_OTHER): Payer: Medicare HMO

## 2023-01-06 DIAGNOSIS — I441 Atrioventricular block, second degree: Secondary | ICD-10-CM | POA: Diagnosis not present

## 2023-01-07 LAB — CUP PACEART REMOTE DEVICE CHECK
Battery Remaining Longevity: 82 mo
Battery Voltage: 2.98 V
Brady Statistic AP VP Percent: 0.83 %
Brady Statistic AP VS Percent: 0 %
Brady Statistic AS VP Percent: 97.05 %
Brady Statistic AS VS Percent: 2.12 %
Brady Statistic RA Percent Paced: 0.95 %
Brady Statistic RV Percent Paced: 97.88 %
Date Time Interrogation Session: 20240716013522
Implantable Lead Connection Status: 753985
Implantable Lead Connection Status: 753985
Implantable Lead Implant Date: 20200117
Implantable Lead Implant Date: 20200117
Implantable Lead Location: 753859
Implantable Lead Location: 753860
Implantable Lead Model: 3830
Implantable Lead Model: 5076
Implantable Pulse Generator Implant Date: 20200117
Lead Channel Impedance Value: 342 Ohm
Lead Channel Impedance Value: 399 Ohm
Lead Channel Impedance Value: 437 Ohm
Lead Channel Impedance Value: 570 Ohm
Lead Channel Pacing Threshold Amplitude: 0.5 V
Lead Channel Pacing Threshold Amplitude: 0.75 V
Lead Channel Pacing Threshold Pulse Width: 0.4 ms
Lead Channel Pacing Threshold Pulse Width: 0.4 ms
Lead Channel Sensing Intrinsic Amplitude: 31.5 mV
Lead Channel Sensing Intrinsic Amplitude: 31.5 mV
Lead Channel Sensing Intrinsic Amplitude: 8 mV
Lead Channel Sensing Intrinsic Amplitude: 8 mV
Lead Channel Setting Pacing Amplitude: 1.5 V
Lead Channel Setting Pacing Amplitude: 2.5 V
Lead Channel Setting Pacing Pulse Width: 0.4 ms
Lead Channel Setting Sensing Sensitivity: 2 mV
Zone Setting Status: 755011

## 2023-01-22 NOTE — Progress Notes (Signed)
Remote pacemaker transmission.   

## 2023-01-23 DIAGNOSIS — L821 Other seborrheic keratosis: Secondary | ICD-10-CM | POA: Diagnosis not present

## 2023-01-23 DIAGNOSIS — L570 Actinic keratosis: Secondary | ICD-10-CM | POA: Diagnosis not present

## 2023-02-17 DIAGNOSIS — R339 Retention of urine, unspecified: Secondary | ICD-10-CM | POA: Diagnosis not present

## 2023-02-24 DIAGNOSIS — H6123 Impacted cerumen, bilateral: Secondary | ICD-10-CM | POA: Diagnosis not present

## 2023-03-18 DIAGNOSIS — Z79899 Other long term (current) drug therapy: Secondary | ICD-10-CM | POA: Diagnosis not present

## 2023-03-18 DIAGNOSIS — N401 Enlarged prostate with lower urinary tract symptoms: Secondary | ICD-10-CM | POA: Diagnosis not present

## 2023-03-18 DIAGNOSIS — R339 Retention of urine, unspecified: Secondary | ICD-10-CM | POA: Diagnosis not present

## 2023-04-06 DIAGNOSIS — Z950 Presence of cardiac pacemaker: Secondary | ICD-10-CM | POA: Diagnosis not present

## 2023-04-06 DIAGNOSIS — E559 Vitamin D deficiency, unspecified: Secondary | ICD-10-CM | POA: Diagnosis not present

## 2023-04-06 DIAGNOSIS — Z79899 Other long term (current) drug therapy: Secondary | ICD-10-CM | POA: Diagnosis not present

## 2023-04-06 DIAGNOSIS — J309 Allergic rhinitis, unspecified: Secondary | ICD-10-CM | POA: Diagnosis not present

## 2023-04-06 DIAGNOSIS — E785 Hyperlipidemia, unspecified: Secondary | ICD-10-CM | POA: Diagnosis not present

## 2023-04-06 DIAGNOSIS — I480 Paroxysmal atrial fibrillation: Secondary | ICD-10-CM | POA: Diagnosis not present

## 2023-04-06 DIAGNOSIS — Z6828 Body mass index (BMI) 28.0-28.9, adult: Secondary | ICD-10-CM | POA: Diagnosis not present

## 2023-04-06 DIAGNOSIS — Z23 Encounter for immunization: Secondary | ICD-10-CM | POA: Diagnosis not present

## 2023-04-07 ENCOUNTER — Ambulatory Visit (INDEPENDENT_AMBULATORY_CARE_PROVIDER_SITE_OTHER): Payer: Medicare HMO

## 2023-04-07 DIAGNOSIS — I480 Paroxysmal atrial fibrillation: Secondary | ICD-10-CM

## 2023-04-07 DIAGNOSIS — I441 Atrioventricular block, second degree: Secondary | ICD-10-CM

## 2023-04-08 LAB — CUP PACEART REMOTE DEVICE CHECK
Battery Remaining Longevity: 76 mo
Battery Voltage: 2.97 V
Brady Statistic AP VP Percent: 0.77 %
Brady Statistic AP VS Percent: 0 %
Brady Statistic AS VP Percent: 96.99 %
Brady Statistic AS VS Percent: 2.24 %
Brady Statistic RA Percent Paced: 0.87 %
Brady Statistic RV Percent Paced: 97.76 %
Date Time Interrogation Session: 20241015004004
Implantable Lead Connection Status: 753985
Implantable Lead Connection Status: 753985
Implantable Lead Implant Date: 20200117
Implantable Lead Implant Date: 20200117
Implantable Lead Location: 753859
Implantable Lead Location: 753860
Implantable Lead Model: 3830
Implantable Lead Model: 5076
Implantable Pulse Generator Implant Date: 20200117
Lead Channel Impedance Value: 323 Ohm
Lead Channel Impedance Value: 380 Ohm
Lead Channel Impedance Value: 437 Ohm
Lead Channel Impedance Value: 532 Ohm
Lead Channel Pacing Threshold Amplitude: 0.625 V
Lead Channel Pacing Threshold Amplitude: 0.875 V
Lead Channel Pacing Threshold Pulse Width: 0.4 ms
Lead Channel Pacing Threshold Pulse Width: 0.4 ms
Lead Channel Sensing Intrinsic Amplitude: 31.5 mV
Lead Channel Sensing Intrinsic Amplitude: 31.5 mV
Lead Channel Sensing Intrinsic Amplitude: 7.125 mV
Lead Channel Sensing Intrinsic Amplitude: 7.125 mV
Lead Channel Setting Pacing Amplitude: 1.5 V
Lead Channel Setting Pacing Amplitude: 2.5 V
Lead Channel Setting Pacing Pulse Width: 0.4 ms
Lead Channel Setting Sensing Sensitivity: 2 mV
Zone Setting Status: 755011

## 2023-04-27 NOTE — Progress Notes (Signed)
Remote pacemaker transmission.   

## 2023-04-28 DIAGNOSIS — R030 Elevated blood-pressure reading, without diagnosis of hypertension: Secondary | ICD-10-CM | POA: Diagnosis not present

## 2023-04-28 DIAGNOSIS — Z950 Presence of cardiac pacemaker: Secondary | ICD-10-CM | POA: Diagnosis not present

## 2023-04-28 DIAGNOSIS — I480 Paroxysmal atrial fibrillation: Secondary | ICD-10-CM | POA: Diagnosis not present

## 2023-04-30 DIAGNOSIS — N401 Enlarged prostate with lower urinary tract symptoms: Secondary | ICD-10-CM | POA: Diagnosis not present

## 2023-04-30 DIAGNOSIS — R339 Retention of urine, unspecified: Secondary | ICD-10-CM | POA: Diagnosis not present

## 2023-06-19 DIAGNOSIS — J069 Acute upper respiratory infection, unspecified: Secondary | ICD-10-CM | POA: Diagnosis not present

## 2023-07-07 ENCOUNTER — Ambulatory Visit (INDEPENDENT_AMBULATORY_CARE_PROVIDER_SITE_OTHER): Payer: Medicare HMO

## 2023-07-07 DIAGNOSIS — I441 Atrioventricular block, second degree: Secondary | ICD-10-CM

## 2023-07-07 LAB — CUP PACEART REMOTE DEVICE CHECK
Battery Remaining Longevity: 72 mo
Battery Voltage: 2.97 V
Brady Statistic AP VP Percent: 0.79 %
Brady Statistic AP VS Percent: 0 %
Brady Statistic AS VP Percent: 96.86 %
Brady Statistic AS VS Percent: 2.35 %
Brady Statistic RA Percent Paced: 0.91 %
Brady Statistic RV Percent Paced: 97.65 %
Date Time Interrogation Session: 20250114024152
Implantable Lead Connection Status: 753985
Implantable Lead Connection Status: 753985
Implantable Lead Implant Date: 20200117
Implantable Lead Implant Date: 20200117
Implantable Lead Location: 753859
Implantable Lead Location: 753860
Implantable Lead Model: 3830
Implantable Lead Model: 5076
Implantable Pulse Generator Implant Date: 20200117
Lead Channel Impedance Value: 361 Ohm
Lead Channel Impedance Value: 437 Ohm
Lead Channel Impedance Value: 494 Ohm
Lead Channel Impedance Value: 570 Ohm
Lead Channel Pacing Threshold Amplitude: 0.625 V
Lead Channel Pacing Threshold Amplitude: 0.625 V
Lead Channel Pacing Threshold Pulse Width: 0.4 ms
Lead Channel Pacing Threshold Pulse Width: 0.4 ms
Lead Channel Sensing Intrinsic Amplitude: 24.125 mV
Lead Channel Sensing Intrinsic Amplitude: 24.125 mV
Lead Channel Sensing Intrinsic Amplitude: 9.125 mV
Lead Channel Sensing Intrinsic Amplitude: 9.125 mV
Lead Channel Setting Pacing Amplitude: 1.5 V
Lead Channel Setting Pacing Amplitude: 2.5 V
Lead Channel Setting Pacing Pulse Width: 0.4 ms
Lead Channel Setting Sensing Sensitivity: 2 mV
Zone Setting Status: 755011

## 2023-07-15 ENCOUNTER — Encounter: Payer: Self-pay | Admitting: Cardiology

## 2023-07-15 NOTE — Progress Notes (Unsigned)
Cardiology Office Note:    Date:  07/16/2023   ID:  [name], DOB 10/15/1933, MRN [id number]  PCP:  Hurshel Party, NP  Cardiologist:  Norman Herrlich, MD    Referring MD: Hurshel Party, NP    ASSESSMENT:    1. Second degree AV block   2. Pacemaker   3. Paroxysmal atrial fibrillation (HCC)   4. Chronic anticoagulation    PLAN:    In order of problems listed above:  Tiler continues to do well with pacemaker therapy no exercise intolerance awareness followed our device clinic and is a 6-year projected battery life He does have paroxysmal atrial fibrillation he is anticoagulated well-tolerated continue his Eliquis Very low burden of atrial fibrillation if it became more frequent symptomatic we could consider an antiarrhythmic drug   Next appointment: I will see him in the office in 1 year we will continue to follow device clinic   Medication Adjustments/Labs and Tests Ordered: Current medicines are reviewed at length with the patient today.  Concerns regarding medicines are outlined above.  No orders of the defined types were placed in this encounter.  No orders of the defined types were placed in this encounter.    History of Present Illness:    Dillon Wallace is a 89 y.o. male with a hx of bradycardia secondary AV block and permanent pacemaker and paroxysmal atrial fibrillation device detected with last seen 10/13/2022.  His most recent device check 07/07/2023 showed normal device and lead function there is atrial tachycardia atrial fibrillation detected less than 1/10 of 1% burden longest episode 4 minutes 47 seconds.  Compliance with diet, lifestyle and medications: Yes  Montrey continues to be active goes to the gym no awareness of his pacemaker and despite having a small burden of atrial fibrillation no palpitation edema shortness of breath chest pain or syncope He continues his anticoagulant without bleeding Recent labs done with his primary care physician 04/06/2023 showed  total cholesterol 169 LDL 89 non-HDL cholesterol 107 hemoglobin 12 point creatinine 0.79 potassium 0.3 Past Medical History:  Diagnosis Date   Bradycardia    Bradycardia with 41-50 beats per minute 06/30/2018   Essential hypertension 08/11/2018   Near syncope 06/24/2018   Pacemaker 08/11/2018   Paroxysmal atrial fibrillation (HCC) 01/12/2020   Pre-syncope 07/08/2018   PSVT (paroxysmal supraventricular tachycardia) (HCC) 06/24/2018   Secondary hypercoagulable state (HCC) 01/12/2020    Current Medications: Current Meds  Medication Sig   atorvastatin (LIPITOR) 20 MG tablet Take 1 tablet by mouth daily.   ELIQUIS 5 MG TABS tablet TAKE ONE TABLET BY MOUTH TWICE DAILY   montelukast (SINGULAIR) 10 MG tablet Take 10 mg by mouth daily.       EKGs/Labs/Other Studies Reviewed:    The following studies were reviewed today:         Recent Labs: No results found for requested labs within last 365 days.  Recent Lipid Panel No results found for: "CHOL", "TRIG", "HDL", "CHOLHDL", "VLDL", "LDLCALC", "LDLDIRECT"  Physical Exam:    VS:  BP 114/70   Pulse (!) 104   Ht 5\' 10"  (1.778 m)   Wt 189 lb 12.8 oz (86.1 kg)   SpO2 98%   BMI 27.23 kg/m     Wt Readings from Last 3 Encounters:  07/16/23 189 lb 12.8 oz (86.1 kg)  10/13/22 198 lb 3.2 oz (89.9 kg)  10/10/22 196 lb (88.9 kg)     GEN:  Well nourished, well developed in no acute distress HEENT: Normal  NECK: No JVD; No carotid bruits LYMPHATICS: No lymphadenopathy CARDIAC: Pocket is healed RRR, no murmurs, rubs, gallops RESPIRATORY:  Clear to auscultation without rales, wheezing or rhonchi  ABDOMEN: Soft, non-tender, non-distended MUSCULOSKELETAL:  No edema; No deformity  SKIN: Warm and dry NEUROLOGIC:  Alert and oriented x 3 PSYCHIATRIC:  Normal affect    Signed, Norman Herrlich, MD  07/16/2023 9:43 AM    [facility]

## 2023-07-16 ENCOUNTER — Ambulatory Visit: Payer: Medicare HMO | Attending: Cardiology | Admitting: Cardiology

## 2023-07-16 ENCOUNTER — Encounter: Payer: Self-pay | Admitting: Cardiology

## 2023-07-16 VITALS — BP 114/70 | HR 104 | Ht 70.0 in | Wt 189.8 lb

## 2023-07-16 DIAGNOSIS — I480 Paroxysmal atrial fibrillation: Secondary | ICD-10-CM | POA: Diagnosis not present

## 2023-07-16 DIAGNOSIS — Z7901 Long term (current) use of anticoagulants: Secondary | ICD-10-CM | POA: Diagnosis not present

## 2023-07-16 DIAGNOSIS — I441 Atrioventricular block, second degree: Secondary | ICD-10-CM | POA: Diagnosis not present

## 2023-07-16 DIAGNOSIS — Z950 Presence of cardiac pacemaker: Secondary | ICD-10-CM | POA: Diagnosis not present

## 2023-07-16 NOTE — Patient Instructions (Signed)

## 2023-07-31 DIAGNOSIS — L820 Inflamed seborrheic keratosis: Secondary | ICD-10-CM | POA: Diagnosis not present

## 2023-07-31 DIAGNOSIS — D2239 Melanocytic nevi of other parts of face: Secondary | ICD-10-CM | POA: Diagnosis not present

## 2023-07-31 DIAGNOSIS — L570 Actinic keratosis: Secondary | ICD-10-CM | POA: Diagnosis not present

## 2023-07-31 DIAGNOSIS — L578 Other skin changes due to chronic exposure to nonionizing radiation: Secondary | ICD-10-CM | POA: Diagnosis not present

## 2023-07-31 DIAGNOSIS — D485 Neoplasm of uncertain behavior of skin: Secondary | ICD-10-CM | POA: Diagnosis not present

## 2023-07-31 DIAGNOSIS — L300 Nummular dermatitis: Secondary | ICD-10-CM | POA: Diagnosis not present

## 2023-07-31 DIAGNOSIS — D225 Melanocytic nevi of trunk: Secondary | ICD-10-CM | POA: Diagnosis not present

## 2023-08-06 ENCOUNTER — Other Ambulatory Visit: Payer: Self-pay | Admitting: Cardiology

## 2023-08-06 NOTE — Telephone Encounter (Signed)
Pt last saw Dr Dulce Sellar 07/16/23, last labs 04/06/23 Creat 0.79 per [facility] at [facility] [place], age 89, weight 86.1kg, based on specified criteria pt is on appropriate dosage of Eliquis 5mg  BID for afib.  Will refill rx.

## 2023-08-14 DIAGNOSIS — D0422 Carcinoma in situ of skin of left ear and external auricular canal: Secondary | ICD-10-CM | POA: Diagnosis not present

## 2023-08-17 DIAGNOSIS — Z6827 Body mass index (BMI) 27.0-27.9, adult: Secondary | ICD-10-CM | POA: Diagnosis not present

## 2023-08-17 DIAGNOSIS — I480 Paroxysmal atrial fibrillation: Secondary | ICD-10-CM | POA: Diagnosis not present

## 2023-08-17 DIAGNOSIS — J309 Allergic rhinitis, unspecified: Secondary | ICD-10-CM | POA: Diagnosis not present

## 2023-08-17 DIAGNOSIS — R339 Retention of urine, unspecified: Secondary | ICD-10-CM | POA: Diagnosis not present

## 2023-08-17 DIAGNOSIS — Z79899 Other long term (current) drug therapy: Secondary | ICD-10-CM | POA: Diagnosis not present

## 2023-08-17 DIAGNOSIS — E785 Hyperlipidemia, unspecified: Secondary | ICD-10-CM | POA: Diagnosis not present

## 2023-08-17 DIAGNOSIS — E559 Vitamin D deficiency, unspecified: Secondary | ICD-10-CM | POA: Diagnosis not present

## 2023-08-17 DIAGNOSIS — D539 Nutritional anemia, unspecified: Secondary | ICD-10-CM | POA: Diagnosis not present

## 2023-08-17 DIAGNOSIS — Z950 Presence of cardiac pacemaker: Secondary | ICD-10-CM | POA: Diagnosis not present

## 2023-08-20 NOTE — Progress Notes (Signed)
 Remote pacemaker transmission.

## 2023-10-06 ENCOUNTER — Ambulatory Visit: Payer: Medicare HMO

## 2023-10-06 DIAGNOSIS — I441 Atrioventricular block, second degree: Secondary | ICD-10-CM | POA: Diagnosis not present

## 2023-10-08 LAB — CUP PACEART REMOTE DEVICE CHECK
Battery Remaining Longevity: 63 mo
Battery Voltage: 2.96 V
Brady Statistic AP VP Percent: 3.65 %
Brady Statistic AP VS Percent: 0 %
Brady Statistic AS VP Percent: 93.89 %
Brady Statistic AS VS Percent: 2.46 %
Brady Statistic RA Percent Paced: 3.89 %
Brady Statistic RV Percent Paced: 97.54 %
Date Time Interrogation Session: 20250415004427
Implantable Lead Connection Status: 753985
Implantable Lead Connection Status: 753985
Implantable Lead Implant Date: 20200117
Implantable Lead Implant Date: 20200117
Implantable Lead Location: 753859
Implantable Lead Location: 753860
Implantable Lead Model: 3830
Implantable Lead Model: 5076
Implantable Pulse Generator Implant Date: 20200117
Lead Channel Impedance Value: 342 Ohm
Lead Channel Impedance Value: 399 Ohm
Lead Channel Impedance Value: 418 Ohm
Lead Channel Impedance Value: 513 Ohm
Lead Channel Pacing Threshold Amplitude: 0.625 V
Lead Channel Pacing Threshold Amplitude: 0.625 V
Lead Channel Pacing Threshold Pulse Width: 0.4 ms
Lead Channel Pacing Threshold Pulse Width: 0.4 ms
Lead Channel Sensing Intrinsic Amplitude: 22.5 mV
Lead Channel Sensing Intrinsic Amplitude: 22.5 mV
Lead Channel Sensing Intrinsic Amplitude: 7.75 mV
Lead Channel Sensing Intrinsic Amplitude: 7.75 mV
Lead Channel Setting Pacing Amplitude: 1.5 V
Lead Channel Setting Pacing Amplitude: 2.5 V
Lead Channel Setting Pacing Pulse Width: 0.4 ms
Lead Channel Setting Sensing Sensitivity: 2 mV
Zone Setting Status: 755011

## 2023-11-05 DIAGNOSIS — R339 Retention of urine, unspecified: Secondary | ICD-10-CM | POA: Diagnosis not present

## 2023-11-05 DIAGNOSIS — N401 Enlarged prostate with lower urinary tract symptoms: Secondary | ICD-10-CM | POA: Diagnosis not present

## 2023-11-06 DIAGNOSIS — L570 Actinic keratosis: Secondary | ICD-10-CM | POA: Diagnosis not present

## 2023-11-06 DIAGNOSIS — D0439 Carcinoma in situ of skin of other parts of face: Secondary | ICD-10-CM | POA: Diagnosis not present

## 2023-11-06 DIAGNOSIS — R233 Spontaneous ecchymoses: Secondary | ICD-10-CM | POA: Diagnosis not present

## 2023-11-06 DIAGNOSIS — D485 Neoplasm of uncertain behavior of skin: Secondary | ICD-10-CM | POA: Diagnosis not present

## 2023-11-19 DIAGNOSIS — D0439 Carcinoma in situ of skin of other parts of face: Secondary | ICD-10-CM | POA: Diagnosis not present

## 2023-11-20 NOTE — Progress Notes (Signed)
 Remote pacemaker transmission.

## 2023-11-20 NOTE — Addendum Note (Signed)
 Addended by: Lott Rouleau A on: 11/20/2023 02:37 PM   Modules accepted: Orders

## 2023-12-18 DIAGNOSIS — K5900 Constipation, unspecified: Secondary | ICD-10-CM | POA: Diagnosis not present

## 2023-12-18 DIAGNOSIS — Z6826 Body mass index (BMI) 26.0-26.9, adult: Secondary | ICD-10-CM | POA: Diagnosis not present

## 2023-12-18 DIAGNOSIS — Z950 Presence of cardiac pacemaker: Secondary | ICD-10-CM | POA: Diagnosis not present

## 2023-12-18 DIAGNOSIS — J309 Allergic rhinitis, unspecified: Secondary | ICD-10-CM | POA: Diagnosis not present

## 2023-12-18 DIAGNOSIS — R339 Retention of urine, unspecified: Secondary | ICD-10-CM | POA: Diagnosis not present

## 2023-12-18 DIAGNOSIS — D539 Nutritional anemia, unspecified: Secondary | ICD-10-CM | POA: Diagnosis not present

## 2023-12-18 DIAGNOSIS — E559 Vitamin D deficiency, unspecified: Secondary | ICD-10-CM | POA: Diagnosis not present

## 2023-12-18 DIAGNOSIS — E785 Hyperlipidemia, unspecified: Secondary | ICD-10-CM | POA: Diagnosis not present

## 2023-12-18 DIAGNOSIS — I480 Paroxysmal atrial fibrillation: Secondary | ICD-10-CM | POA: Diagnosis not present

## 2024-01-05 ENCOUNTER — Ambulatory Visit: Payer: Medicare HMO

## 2024-01-05 DIAGNOSIS — I441 Atrioventricular block, second degree: Secondary | ICD-10-CM | POA: Diagnosis not present

## 2024-01-06 ENCOUNTER — Ambulatory Visit: Payer: Self-pay | Admitting: Cardiology

## 2024-01-06 DIAGNOSIS — H35349 Macular cyst, hole, or pseudohole, unspecified eye: Secondary | ICD-10-CM | POA: Diagnosis not present

## 2024-01-06 LAB — CUP PACEART REMOTE DEVICE CHECK
Battery Remaining Longevity: 58 mo
Battery Voltage: 2.96 V
Brady Statistic AP VP Percent: 4.19 %
Brady Statistic AP VS Percent: 0 %
Brady Statistic AS VP Percent: 93.39 %
Brady Statistic AS VS Percent: 2.41 %
Brady Statistic RA Percent Paced: 4.55 %
Brady Statistic RV Percent Paced: 97.58 %
Date Time Interrogation Session: 20250714233127
Implantable Lead Connection Status: 753985
Implantable Lead Connection Status: 753985
Implantable Lead Implant Date: 20200117
Implantable Lead Implant Date: 20200117
Implantable Lead Location: 753859
Implantable Lead Location: 753860
Implantable Lead Model: 3830
Implantable Lead Model: 5076
Implantable Pulse Generator Implant Date: 20200117
Lead Channel Impedance Value: 342 Ohm
Lead Channel Impedance Value: 399 Ohm
Lead Channel Impedance Value: 437 Ohm
Lead Channel Impedance Value: 513 Ohm
Lead Channel Pacing Threshold Amplitude: 0.625 V
Lead Channel Pacing Threshold Amplitude: 0.625 V
Lead Channel Pacing Threshold Pulse Width: 0.4 ms
Lead Channel Pacing Threshold Pulse Width: 0.4 ms
Lead Channel Sensing Intrinsic Amplitude: 19 mV
Lead Channel Sensing Intrinsic Amplitude: 19 mV
Lead Channel Sensing Intrinsic Amplitude: 7.875 mV
Lead Channel Sensing Intrinsic Amplitude: 7.875 mV
Lead Channel Setting Pacing Amplitude: 1.5 V
Lead Channel Setting Pacing Amplitude: 2.5 V
Lead Channel Setting Pacing Pulse Width: 0.4 ms
Lead Channel Setting Sensing Sensitivity: 2 mV
Zone Setting Status: 755011

## 2024-02-23 DIAGNOSIS — H6123 Impacted cerumen, bilateral: Secondary | ICD-10-CM | POA: Diagnosis not present

## 2024-03-04 DIAGNOSIS — D485 Neoplasm of uncertain behavior of skin: Secondary | ICD-10-CM | POA: Diagnosis not present

## 2024-03-04 DIAGNOSIS — D0439 Carcinoma in situ of skin of other parts of face: Secondary | ICD-10-CM | POA: Diagnosis not present

## 2024-03-16 DIAGNOSIS — C4441 Basal cell carcinoma of skin of scalp and neck: Secondary | ICD-10-CM | POA: Diagnosis not present

## 2024-03-30 NOTE — Progress Notes (Signed)
 Remote PPM Transmission

## 2024-04-05 ENCOUNTER — Ambulatory Visit: Payer: Medicare HMO

## 2024-04-05 DIAGNOSIS — I441 Atrioventricular block, second degree: Secondary | ICD-10-CM

## 2024-04-06 LAB — CUP PACEART REMOTE DEVICE CHECK
Battery Remaining Longevity: 51 mo
Battery Voltage: 2.95 V
Brady Statistic AP VP Percent: 3.62 %
Brady Statistic AP VS Percent: 0 %
Brady Statistic AS VP Percent: 94.33 %
Brady Statistic AS VS Percent: 2.06 %
Brady Statistic RA Percent Paced: 4.01 %
Brady Statistic RV Percent Paced: 97.94 %
Date Time Interrogation Session: 20251014022124
Implantable Lead Connection Status: 753985
Implantable Lead Connection Status: 753985
Implantable Lead Implant Date: 20200117
Implantable Lead Implant Date: 20200117
Implantable Lead Location: 753859
Implantable Lead Location: 753860
Implantable Lead Model: 3830
Implantable Lead Model: 5076
Implantable Pulse Generator Implant Date: 20200117
Lead Channel Impedance Value: 323 Ohm
Lead Channel Impedance Value: 380 Ohm
Lead Channel Impedance Value: 437 Ohm
Lead Channel Impedance Value: 494 Ohm
Lead Channel Pacing Threshold Amplitude: 0.625 V
Lead Channel Pacing Threshold Amplitude: 0.75 V
Lead Channel Pacing Threshold Pulse Width: 0.4 ms
Lead Channel Pacing Threshold Pulse Width: 0.4 ms
Lead Channel Sensing Intrinsic Amplitude: 19 mV
Lead Channel Sensing Intrinsic Amplitude: 19 mV
Lead Channel Sensing Intrinsic Amplitude: 7.625 mV
Lead Channel Sensing Intrinsic Amplitude: 7.625 mV
Lead Channel Setting Pacing Amplitude: 1.5 V
Lead Channel Setting Pacing Amplitude: 2.5 V
Lead Channel Setting Pacing Pulse Width: 0.4 ms
Lead Channel Setting Sensing Sensitivity: 2 mV
Zone Setting Status: 755011

## 2024-04-08 NOTE — Progress Notes (Signed)
 Remote PPM Transmission

## 2024-04-18 DIAGNOSIS — I480 Paroxysmal atrial fibrillation: Secondary | ICD-10-CM | POA: Diagnosis not present

## 2024-04-18 DIAGNOSIS — Z23 Encounter for immunization: Secondary | ICD-10-CM | POA: Diagnosis not present

## 2024-04-18 DIAGNOSIS — Z950 Presence of cardiac pacemaker: Secondary | ICD-10-CM | POA: Diagnosis not present

## 2024-04-18 DIAGNOSIS — E559 Vitamin D deficiency, unspecified: Secondary | ICD-10-CM | POA: Diagnosis not present

## 2024-04-18 DIAGNOSIS — J309 Allergic rhinitis, unspecified: Secondary | ICD-10-CM | POA: Diagnosis not present

## 2024-04-18 DIAGNOSIS — E785 Hyperlipidemia, unspecified: Secondary | ICD-10-CM | POA: Diagnosis not present

## 2024-04-18 DIAGNOSIS — Z6827 Body mass index (BMI) 27.0-27.9, adult: Secondary | ICD-10-CM | POA: Diagnosis not present

## 2024-04-18 DIAGNOSIS — R600 Localized edema: Secondary | ICD-10-CM | POA: Diagnosis not present

## 2024-04-18 DIAGNOSIS — D539 Nutritional anemia, unspecified: Secondary | ICD-10-CM | POA: Diagnosis not present

## 2024-04-19 ENCOUNTER — Ambulatory Visit: Payer: Self-pay | Admitting: Cardiology

## 2024-04-27 DIAGNOSIS — E538 Deficiency of other specified B group vitamins: Secondary | ICD-10-CM | POA: Diagnosis not present

## 2024-05-04 DIAGNOSIS — E538 Deficiency of other specified B group vitamins: Secondary | ICD-10-CM | POA: Diagnosis not present

## 2024-05-10 DIAGNOSIS — Z125 Encounter for screening for malignant neoplasm of prostate: Secondary | ICD-10-CM | POA: Diagnosis not present

## 2024-05-10 DIAGNOSIS — R339 Retention of urine, unspecified: Secondary | ICD-10-CM | POA: Diagnosis not present

## 2024-05-10 DIAGNOSIS — N401 Enlarged prostate with lower urinary tract symptoms: Secondary | ICD-10-CM | POA: Diagnosis not present

## 2024-05-11 DIAGNOSIS — E538 Deficiency of other specified B group vitamins: Secondary | ICD-10-CM | POA: Diagnosis not present

## 2024-05-18 DIAGNOSIS — E538 Deficiency of other specified B group vitamins: Secondary | ICD-10-CM | POA: Diagnosis not present

## 2024-07-05 ENCOUNTER — Ambulatory Visit: Payer: Medicare HMO

## 2024-07-05 DIAGNOSIS — I441 Atrioventricular block, second degree: Secondary | ICD-10-CM

## 2024-07-06 ENCOUNTER — Ambulatory Visit: Payer: Self-pay | Admitting: Cardiology

## 2024-07-06 LAB — CUP PACEART REMOTE DEVICE CHECK
Battery Remaining Longevity: 46 mo
Battery Voltage: 2.95 V
Brady Statistic AP VP Percent: 2.41 %
Brady Statistic AP VS Percent: 0 %
Brady Statistic AS VP Percent: 95.39 %
Brady Statistic AS VS Percent: 2.2 %
Brady Statistic RA Percent Paced: 2.74 %
Brady Statistic RV Percent Paced: 97.8 %
Date Time Interrogation Session: 20260113004703
Implantable Lead Connection Status: 753985
Implantable Lead Connection Status: 753985
Implantable Lead Implant Date: 20200117
Implantable Lead Implant Date: 20200117
Implantable Lead Location: 753859
Implantable Lead Location: 753860
Implantable Lead Model: 3830
Implantable Lead Model: 5076
Implantable Pulse Generator Implant Date: 20200117
Lead Channel Impedance Value: 323 Ohm
Lead Channel Impedance Value: 399 Ohm
Lead Channel Impedance Value: 456 Ohm
Lead Channel Impedance Value: 513 Ohm
Lead Channel Pacing Threshold Amplitude: 0.625 V
Lead Channel Pacing Threshold Amplitude: 0.75 V
Lead Channel Pacing Threshold Pulse Width: 0.4 ms
Lead Channel Pacing Threshold Pulse Width: 0.4 ms
Lead Channel Sensing Intrinsic Amplitude: 14.25 mV
Lead Channel Sensing Intrinsic Amplitude: 14.25 mV
Lead Channel Sensing Intrinsic Amplitude: 7.625 mV
Lead Channel Sensing Intrinsic Amplitude: 7.625 mV
Lead Channel Setting Pacing Amplitude: 1.5 V
Lead Channel Setting Pacing Amplitude: 2.5 V
Lead Channel Setting Pacing Pulse Width: 0.4 ms
Lead Channel Setting Sensing Sensitivity: 2 mV
Zone Setting Status: 755011

## 2024-07-08 NOTE — Progress Notes (Signed)
 Remote PPM Transmission

## 2024-07-18 NOTE — Progress Notes (Unsigned)
 " Cardiology Office Note:    Date:  07/19/2024   ID:  Dillon Wallace, DOB [birth year], MRN [id number]  PCP:  Dillon Greig LABOR, NP  Cardiologist:  Dillon Leiter, MD    Referring MD: Dillon Greig LABOR, NP    ASSESSMENT:    1. Second degree AV block   2. Pacemaker   3. Paroxysmal atrial fibrillation (HCC)   4. Chronic anticoagulation    PLAN:    In order of problems listed above:  [name] has done exceptionally well following pacemaker insertion [place]  Heart Association class I active no cardiovascular symptomatology Will device function followed in our clinic Not having recurrent frequent atrial fibrillation Continue his anticoagulant Continue his statin lipids are ideal   Next appointment: Plan to see him in 1 year   Medication Adjustments/Labs and Tests Ordered: Current medicines are reviewed at length with the patient today.  Concerns regarding medicines are outlined above.  Orders Placed This Encounter  Procedures   EKG 12-Lead   No orders of the defined types were placed in this encounter.    History of Present Illness:    Dillon Wallace is a 89 y.o. male with a hx of bradycardia second-degree AV block and permanent pacemaker paroxysmal atrial fibrillation device detected and chronic anticoagulation last seen 07/16/2023.  Compliance with diet, lifestyle and medications: Yes [name] is very pleased with the quality of his life goes to the gym 3 days a week very physically active and has had no vascular symptoms of exercise edema shortness of breath orthopnea chest pain palp potation or syncope He tolerates his anticoagulant without bleeding and his statin without muscle pain or weakness. Labs with his PCP 04/18/2024 cholesterol 158 LDL 86 hemoglobin 12.7 creatinine 103 potassium 4.2 Past Medical History:  Diagnosis Date   Bradycardia    Bradycardia with 41-50 beats per minute 06/30/2018   Essential hypertension 08/11/2018   Near syncope 06/24/2018   Pacemaker 08/11/2018    Paroxysmal atrial fibrillation (HCC) 01/12/2020   Pre-syncope 07/08/2018   PSVT (paroxysmal supraventricular tachycardia) 06/24/2018   Secondary hypercoagulable state 01/12/2020    Current Medications: Active Medications[1]    EKGs/Labs/Other Studies Reviewed:    The following studies were reviewed today:  Cardiac Studies & Procedures   ______________________________________________________________________________________________   STRESS TESTS  MYOCARDIAL PERFUSION IMAGING 10/22/2021  Interpretation Summary   No evidence of ischemia. The study is intermediate risk.   Left ventricular function is abnormal. Nuclear stress EF: 41 %. The left ventricular ejection fraction is moderately decreased (30-44%). End diastolic cavity size is mildly enlarged.   Prior study not available for comparison.   ECHOCARDIOGRAM  ECHOCARDIOGRAM COMPLETE 07/09/2018  Narrative *[place]* *[facility]* [address] [place], [place] [address] [phone]  ------------------------------------------------------------------- Transthoracic Echocardiography  Patient:    [name], [name] MR #:       [id number] Study Date: 07/09/2018 Gender:     M Age:        89 Height:     177.8 cm Weight:     83.1 kg BSA:        2.04 m^2 Pt. Status: Room:  ADMITTING    Salena Negri, MD ATTENDING    Salena Negri, MD ORDERING     Salena Negri, MD PERFORMING   Salena Negri, MD REFERRING    Salena Negri, MD SONOGRAPHER  Mal Nora  cc:  ------------------------------------------------------------------- LV EF: 60% -   65%  ------------------------------------------------------------------- Indications:      Syncope 780.2.  ------------------------------------------------------------------- History:  PMH:  Pre-syncope.  Syncope.  ------------------------------------------------------------------- Study Conclusions  - Left ventricle: The cavity size was normal. Systolic function  was normal. The estimated ejection fraction was in the range of 60% to 65%. Wall motion was normal; there were no regional wall motion abnormalities. - Mitral valve: Calcified annulus. Mildly thickened leaflets . There was moderate regurgitation. - Left atrium: The atrium was mildly dilated. - Right atrium: The atrium was mildly dilated.  ------------------------------------------------------------------- Study data:  No prior study was available for comparison.  Study status:  Routine.  Procedure:  The patient reported no pain pre or post test. Transthoracic echocardiography. Image quality was adequate.  Study completion:  There were no complications. Transthoracic echocardiography.  M-mode, complete 2D, spectral Doppler, and color Doppler.  Birthdate:  Patient birthdate: Nov 30, 1933.  Age:  Patient is 89 yr old.  Sex:  Gender: male. BMI: 26.3 kg/m^2.  Blood pressure:     149/77  Patient status: Inpatient.  Study date:  Study date: 07/09/2018. Study time: 07:54 AM.  Location:  Bedside.  -------------------------------------------------------------------  ------------------------------------------------------------------- Left ventricle:  The cavity size was normal. Systolic function was normal. The estimated ejection fraction was in the range of 60% to 65%. Wall motion was normal; there were no regional wall motion abnormalities.  ------------------------------------------------------------------- Aortic valve:   Trileaflet; moderately thickened, severely calcified leaflets. Mobility was not restricted. Sclerosis without stenosis.  Doppler:  Transvalvular velocity was within the normal range. There was no stenosis. There was no regurgitation.  ------------------------------------------------------------------- Aorta:  Aortic root: The aortic root was normal in size.  ------------------------------------------------------------------- Mitral valve:   Calcified annulus. Mildly  thickened leaflets . Mobility was not restricted.  Doppler:  Transvalvular velocity was within the normal range. There was no evidence for stenosis. There was moderate regurgitation.    Peak gradient (D): 5 mm Hg.  ------------------------------------------------------------------- Left atrium:  The atrium was mildly dilated.  ------------------------------------------------------------------- Right ventricle:  The cavity size was normal. Wall thickness was normal. The moderator band was prominent. Systolic function was normal.  ------------------------------------------------------------------- Pulmonic valve:    The valve appears to be grossly normal. Doppler:  Transvalvular velocity was within the normal range. There was no evidence for stenosis.  ------------------------------------------------------------------- Tricuspid valve:   Structurally normal valve.    Doppler: Transvalvular velocity was within the normal range. There was mild regurgitation.  ------------------------------------------------------------------- Pulmonary artery:   The main pulmonary artery was normal-sized. Systolic pressure was within the normal range.  ------------------------------------------------------------------- Right atrium:  The atrium was mildly dilated.  ------------------------------------------------------------------- Pericardium:  There was no pericardial effusion.  ------------------------------------------------------------------- Systemic veins: Inferior vena cava: The vessel was normal in size. The respirophasic diameter changes were in the normal range (>= 50%), consistent with normal central venous pressure.  ------------------------------------------------------------------- Measurements  Left ventricle                         Value        Reference LV ID, ED, PLAX chordal                52    mm     43 - 52 LV ID, ES, PLAX chordal                33    mm     23 - 38 LV  fx shortening, PLAX chordal         37    %      >=29 LV PW thickness, ED  9     mm     ---------- IVS/LV PW ratio, ED                    0.89         <=1.3 Stroke volume, 2D                      87    ml     ---------- Stroke volume/bsa, 2D                  43    ml/m^2 ---------- LV e&', lateral                         12.2  cm/s   ---------- LV E/e&', lateral                       9.51         ---------- LV e&', medial                          13.3  cm/s   ---------- LV E/e&', medial                        8.72         ---------- LV e&', average                         12.75 cm/s   ---------- LV E/e&', average                       9.1          ----------  Ventricular septum                     Value        Reference IVS thickness, ED                      8     mm     ----------  LVOT                                   Value        Reference LVOT ID, S                             22    mm     ---------- LVOT area                              3.8   cm^2   ---------- LVOT peak velocity, S                  99    cm/s   ---------- LVOT mean velocity, S                  57.7  cm/s   ---------- LVOT VTI, S                            22.9  cm     ----------  Aorta  Value        Reference Aortic root ID, ED                     33    mm     ----------  Left atrium                            Value        Reference LA ID, A-P, ES                         41    mm     ---------- LA ID/bsa, A-P                         2.01  cm/m^2 <=2.2 LA volume, S                           47.6  ml     ---------- LA volume/bsa, S                       23.4  ml/m^2 ---------- LA volume, ES, 1-p A4C                 42.6  ml     ---------- LA volume/bsa, ES, 1-p A4C             20.9  ml/m^2 ---------- LA volume, ES, 1-p A2C                 51.8  ml     ---------- LA volume/bsa, ES, 1-p A2C             25.4  ml/m^2 ----------  Mitral valve                            Value        Reference Mitral E-wave peak velocity            116   cm/s   ---------- Mitral A-wave peak velocity            71.6  cm/s   ---------- Mitral deceleration time       (L)     100   ms     150 - 230 Mitral peak gradient, D                5     mm Hg  ---------- Mitral E/A ratio, peak                 1.6          ----------  Right atrium                           Value        Reference RA ID, S-I, ES, A4C            (H)     57.9  mm     34 - 49 RA area, ES, A4C                       15    cm^2   8.3 - 19.5 RA volume, ES, A/L  33.9  ml     ---------- RA volume/bsa, ES, A/L                 16.6  ml/m^2 ----------  Systemic veins                         Value        Reference Estimated CVP                          8     mm Hg  ----------  Right ventricle                        Value        Reference TAPSE                                  23.9  mm     ---------- RV s&', lateral, S                      9.36  cm/s   ----------  Legend: (L)  and  (H)  mark values outside specified reference range.  ------------------------------------------------------------------- Prepared and Electronically Authenticated by  Salena Negri, MD 2020-01-17T11:54:15          ______________________________________________________________________________________________       Physical Exam:    VS:  BP 112/74   Pulse 86   Ht 5' 10 (1.778 m)   Wt 185 lb (83.9 kg)   SpO2 96%   BMI 26.54 kg/m     Wt Readings from Last 3 Encounters:  07/19/24 185 lb (83.9 kg)  07/16/23 189 lb 12.8 oz (86.1 kg)  10/13/22 198 lb 3.2 oz (89.9 kg)     GEN:  Well nourished, well developed in no acute distress HEENT: Normal NECK: No JVD; No carotid bruits LYMPHATICS: No lymphadenopathy CARDIAC: RRR, no murmurs, rubs, gallops RESPIRATORY:  Clear to auscultation without rales, wheezing or rhonchi  ABDOMEN: Soft, non-tender, non-distended MUSCULOSKELETAL:  No edema; No deformity   SKIN: Warm and dry NEUROLOGIC:  Alert and oriented x 3 PSYCHIATRIC:  Normal affect    Signed, Dillon Leiter, MD  07/19/2024 4:35 PM    [facility]      [1]  Current Meds  Medication Sig   atorvastatin (LIPITOR) 20 MG tablet Take 1 tablet by mouth daily.   ELIQUIS  5 MG TABS tablet TAKE ONE TABLET BY MOUTH TWICE DAILY   montelukast  (SINGULAIR ) 10 MG tablet Take 10 mg by mouth daily.    "

## 2024-07-19 ENCOUNTER — Ambulatory Visit: Attending: Cardiology | Admitting: Cardiology

## 2024-07-19 ENCOUNTER — Encounter: Payer: Self-pay | Admitting: Cardiology

## 2024-07-19 VITALS — BP 112/74 | HR 86 | Ht 70.0 in | Wt 185.0 lb

## 2024-07-19 DIAGNOSIS — I441 Atrioventricular block, second degree: Secondary | ICD-10-CM | POA: Diagnosis not present

## 2024-07-19 DIAGNOSIS — Z950 Presence of cardiac pacemaker: Secondary | ICD-10-CM | POA: Diagnosis not present

## 2024-07-19 DIAGNOSIS — E782 Mixed hyperlipidemia: Secondary | ICD-10-CM | POA: Diagnosis not present

## 2024-07-19 DIAGNOSIS — I480 Paroxysmal atrial fibrillation: Secondary | ICD-10-CM | POA: Diagnosis not present

## 2024-07-19 DIAGNOSIS — Z7901 Long term (current) use of anticoagulants: Secondary | ICD-10-CM

## 2024-07-19 NOTE — Patient Instructions (Addendum)
 Medication Instructions:  Your physician recommends that you continue on your current medications as directed. Please refer to the Current Medication list given to you today.  *If you need a refill on your cardiac medications before your next appointment, please call your pharmacy*   Lab Work: None ordered If you have labs (blood work) drawn today and your tests are completely normal, you will receive your results only by: MyChart Message (if you have MyChart) OR A paper copy in the mail If you have any lab test that is abnormal or we need to change your treatment, we will call you to review the results.   Testing/Procedures: None ordered   Follow-Up: At [facility], you and your health needs are our priority.  As part of our continuing mission to provide you with exceptional heart care, we have created designated Provider Care Teams.  These Care Teams include your primary Cardiologist (physician) and Advanced Practice Providers (APPs -  Physician Assistants and Nurse Practitioners) who all work together to provide you with the care you need, when you need it.  We recommend signing up for the patient portal called MyChart.  Sign up information is provided on this After Visit Summary.  MyChart is used to connect with patients for Virtual Visits (Telemedicine).  Patients are able to view lab/test results, encounter notes, upcoming appointments, etc.  Non-urgent messages can be sent to your provider as well.   To learn more about what you can do with MyChart, go to [email or web address].    Your next appointment:   12 month(s)  The format for your next appointment:   In Person  Provider:   Redell Leiter, MD    Other Instructions            Important Information About Sugar

## 2024-10-04 ENCOUNTER — Ambulatory Visit

## 2025-01-03 ENCOUNTER — Ambulatory Visit

## 2025-04-04 ENCOUNTER — Ambulatory Visit

## 2025-07-04 ENCOUNTER — Ambulatory Visit

## 2025-10-03 ENCOUNTER — Ambulatory Visit
